# Patient Record
Sex: Female | Born: 1990 | Race: Black or African American | Hispanic: No | Marital: Single | Smoking: Never smoker
Health system: Southern US, Community
[De-identification: ages and names within clinical notes are randomized; demographics above are authoritative.]

## PROBLEM LIST (undated history)

## (undated) ENCOUNTER — Inpatient Hospital Stay: Payer: Self-pay

## (undated) DIAGNOSIS — Z8742 Personal history of other diseases of the female genital tract: Secondary | ICD-10-CM

## (undated) DIAGNOSIS — E782 Mixed hyperlipidemia: Secondary | ICD-10-CM

## (undated) DIAGNOSIS — R7302 Impaired glucose tolerance (oral): Secondary | ICD-10-CM

## (undated) HISTORY — DX: Personal history of other diseases of the female genital tract: Z87.42

## (undated) HISTORY — DX: Impaired glucose tolerance (oral): R73.02

## (undated) HISTORY — DX: Mixed hyperlipidemia: E78.2

---
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
## 2008-11-21 IMAGING — US US BREAST BILAT
1 series · 17 of 17 positions shown · non-contrast
Comparison: none

REASON FOR EXAM: Possible cyst in both breasts
COMMENTS:

[Series 1: us breast bilat · 17 of 17 slices shown]
[im 1/17]
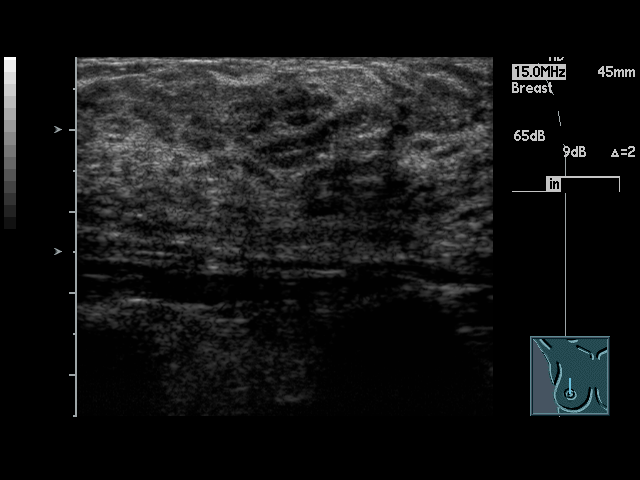
[im 2/17]
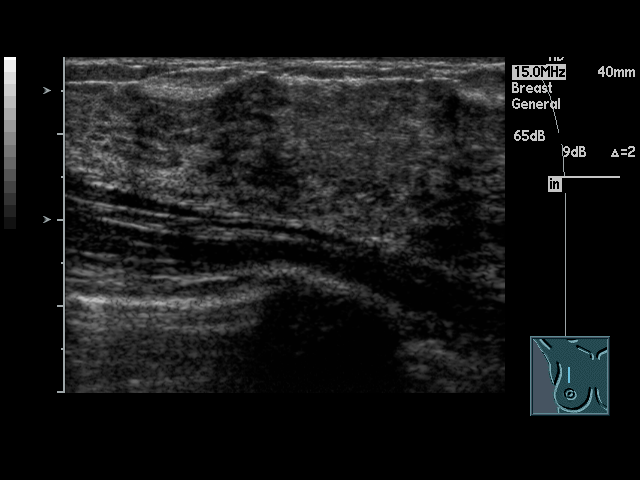
[im 3/17]
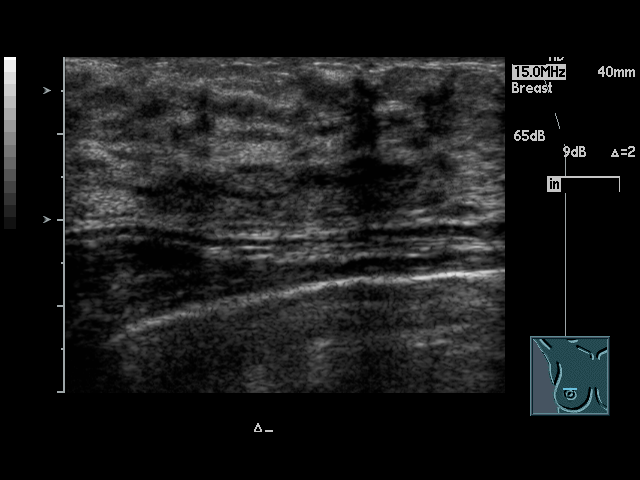
[im 4/17]
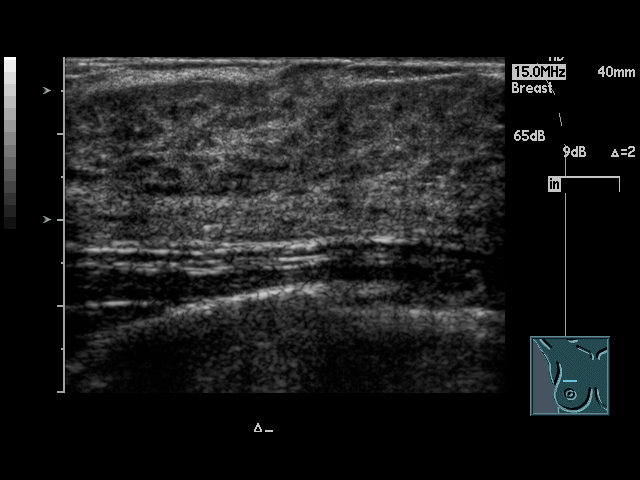
[im 5/17]
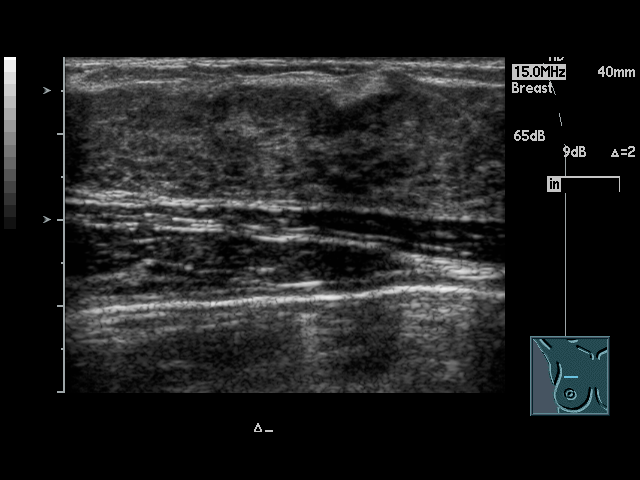
[im 6/17]
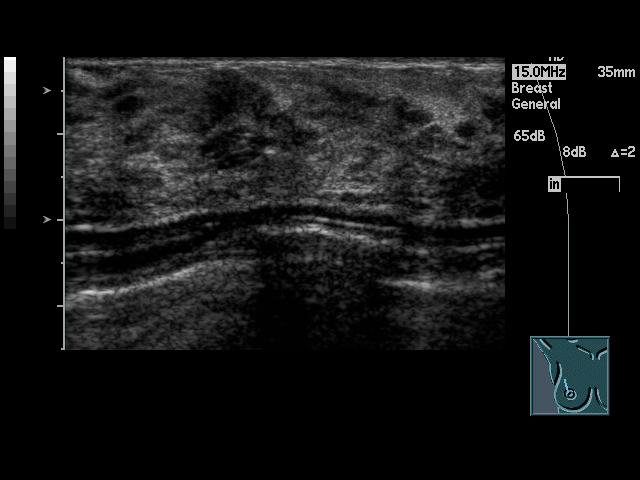
[im 7/17]
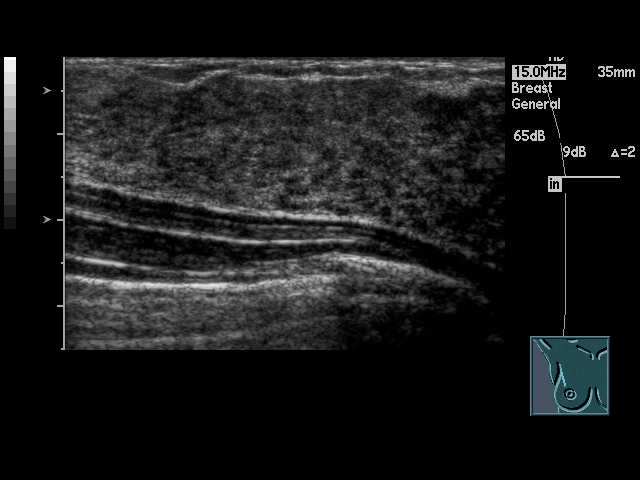
[im 8/17]
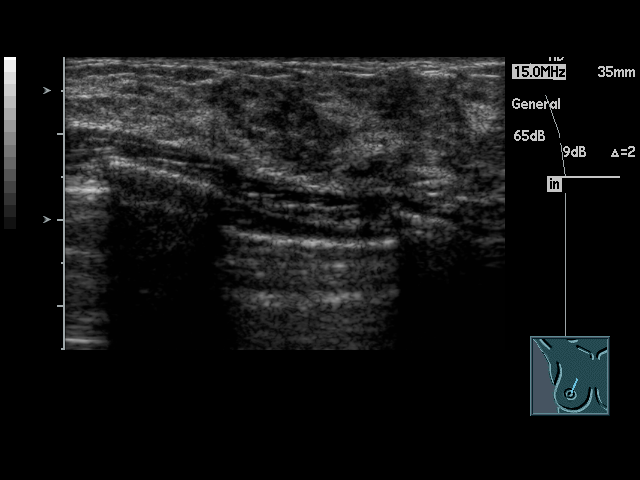
[im 9/17]
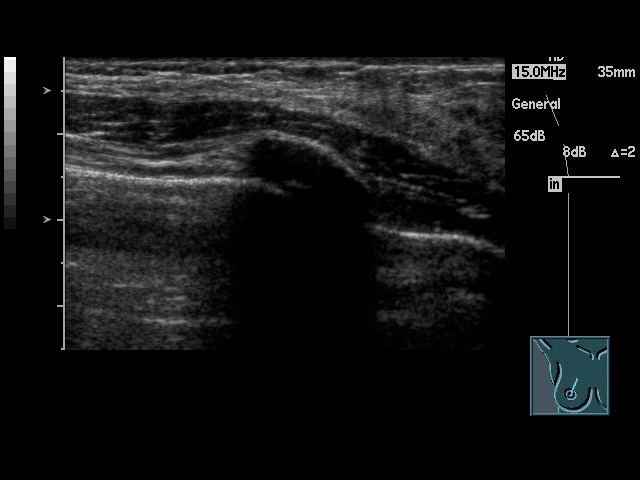
[im 10/17]
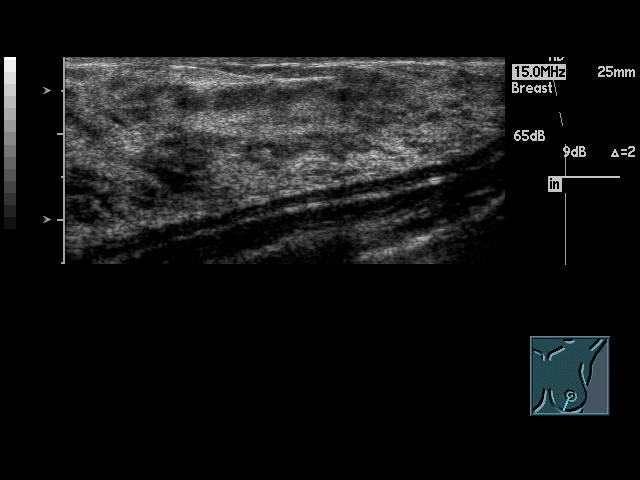
[im 11/17]
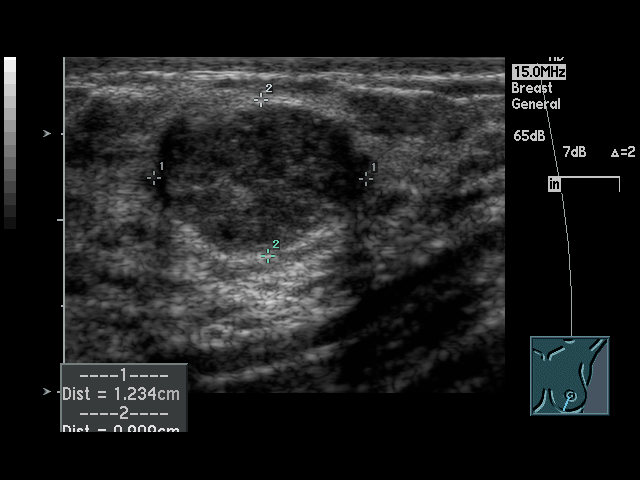
[im 12/17]
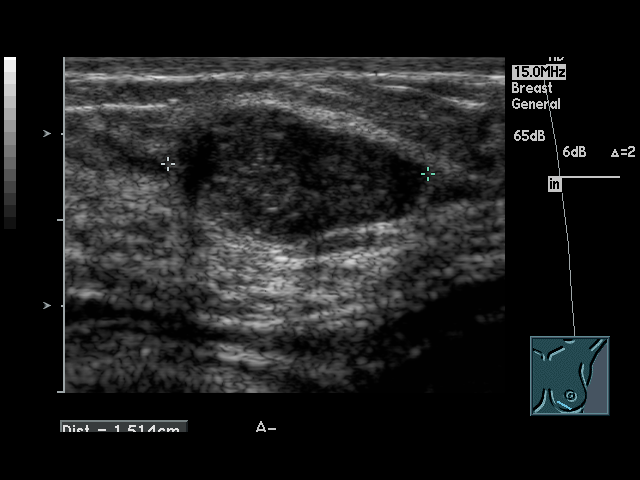
[im 13/17]
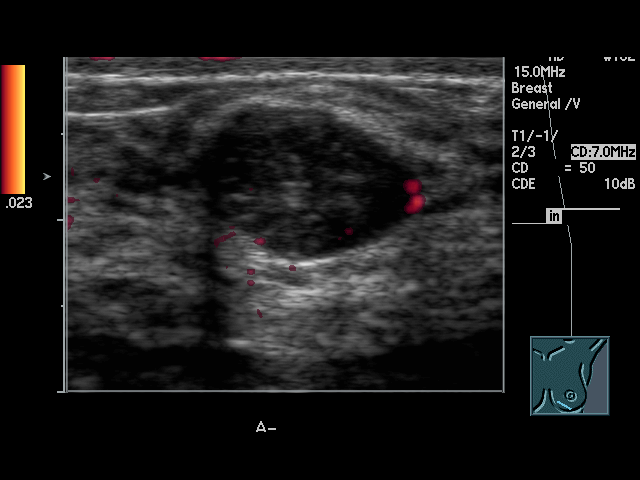
[im 14/17]
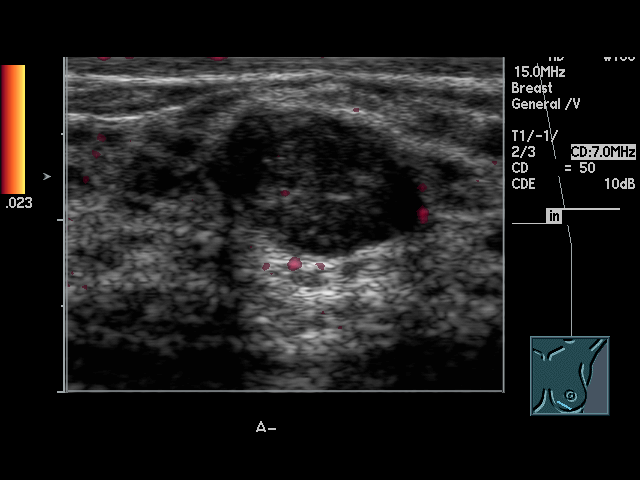
[im 15/17]
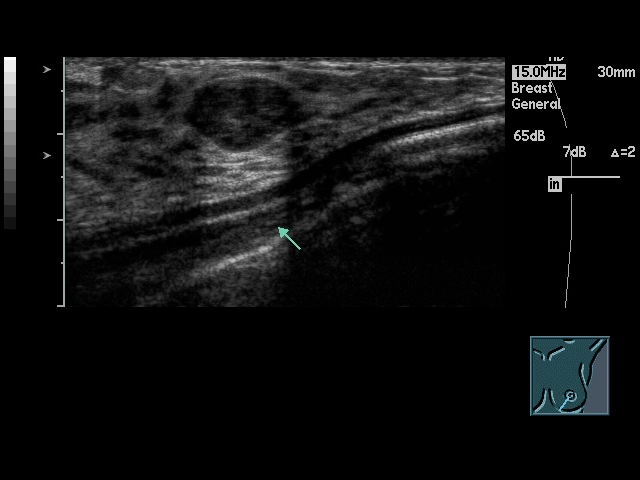
[im 16/17]
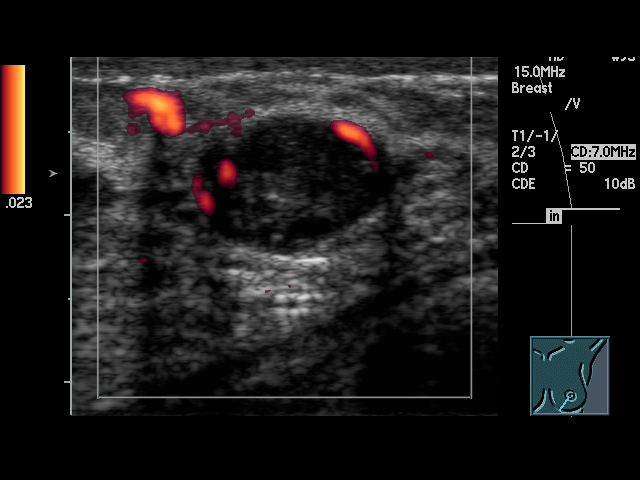
[im 17/17]
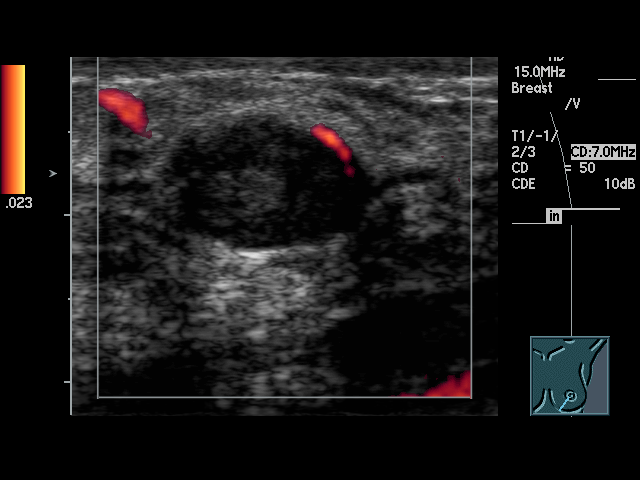

[17 of 17 positions shown; findings below may reference images not displayed]

PROCEDURE:     US  - US BREAST BILATERAL  - February 14, 2007 [DATE]

RESULT:     Bilateral Breast Ultrasound was performed.

The patient has a palpable area of concern in the RIGHT breast. This area
and the adjacent breast parenchyma between 11 o'clock and 1 o'clock in the
RIGHT breast were scanned. No significant sonographic abnormalities are
seen. No solid or cystic mass lesions are seen. There is no skin thickening.
No distortion of the breast architecture is seen.

The patient has a palpable area of concern in the LEFT breast between 6 and
7 o'clock. Sonographic examination, targeted to this region, shows a
hypoechoic, solid appearing mass. The mass measures 1.514 cm at maximum
diameter which is similar to the maximum measurement obtained on the prior
exam of December 27, 2005. In this age group, a fibroadenoma would be the
primary consideration on a statistical basis. Ultrasound findings, however,
are nonspecific.
IMPRESSION: 1.  No significant sonographic abnormalities are noted on the RIGHT.
2.  On the LEFT, there is a hypoechoic, solid, oval-shaped mass that has not
changed appreciably in size since the prior exam. Fibroadenoma would be the
first consideration but biopsy would be needed to definitely establish
histology.

## 2009-10-26 ENCOUNTER — Ambulatory Visit: Payer: Self-pay | Admitting: Internal Medicine

## 2011-08-03 IMAGING — US US BREAST BILAT
1 series · 17 of 25 positions shown · non-contrast
Comparison: none

REASON FOR EXAM: R br mass 9 oclock  L br mass  6-7 oclock
COMMENTS:

[Series 1: us breast bilat · 17 of 25 slices shown]
[im 1/25]
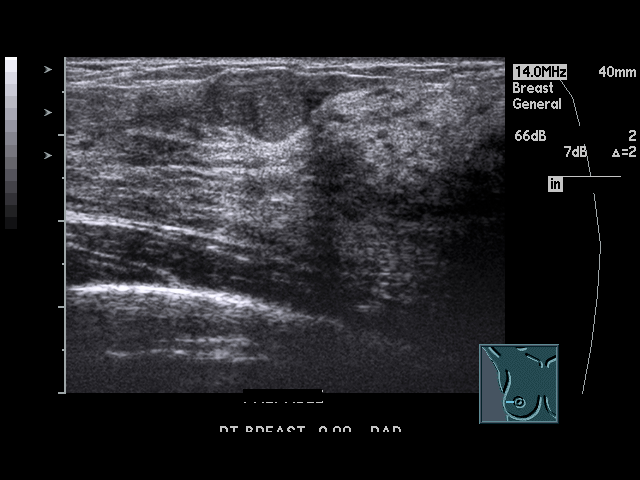
[im 3/25]
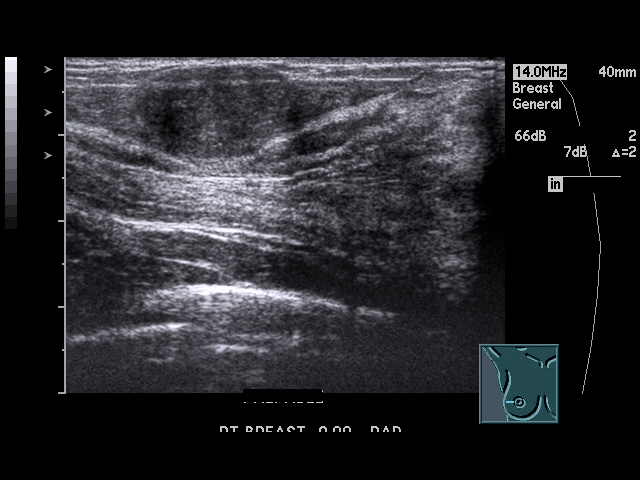
[im 4/25]
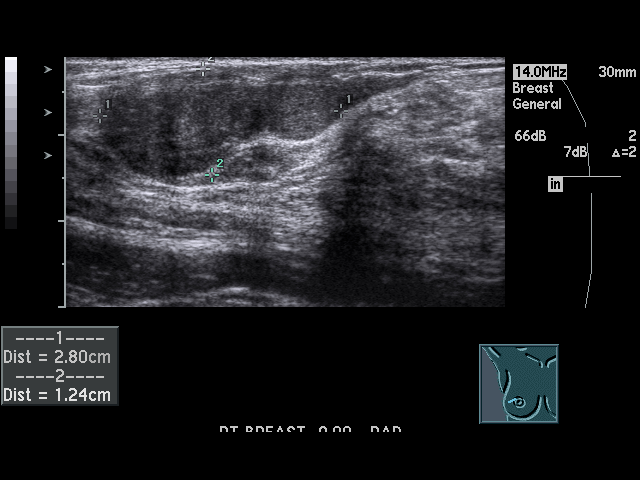
[im 6/25]
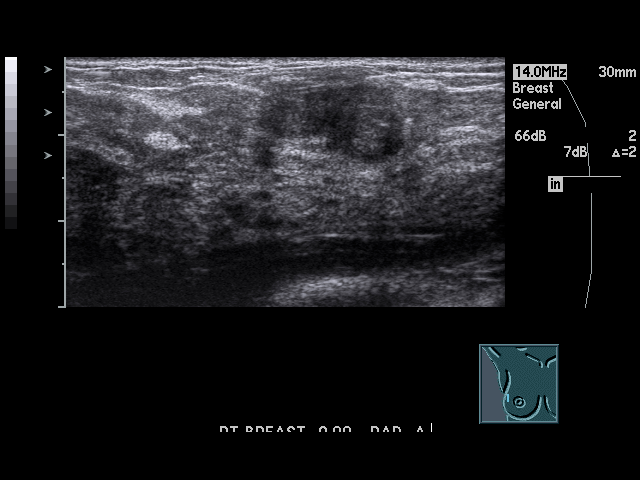
[im 7/25]
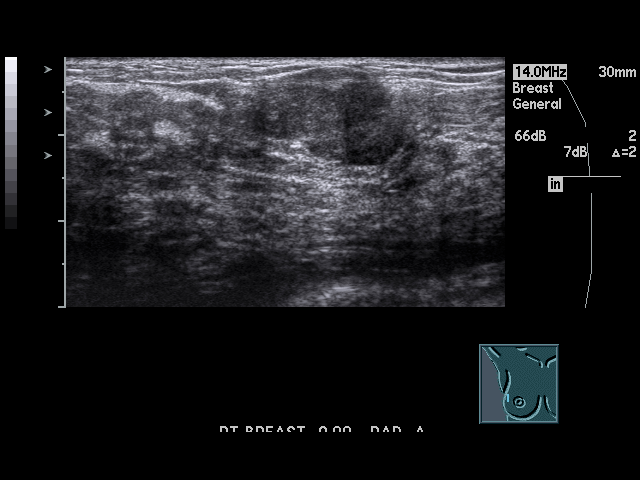
[im 9/25]
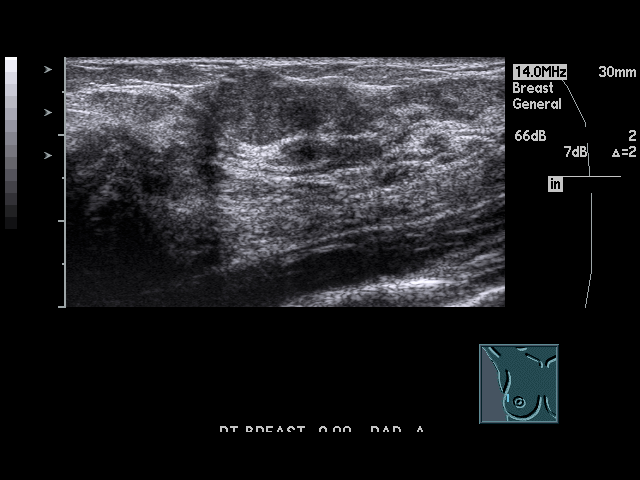
[im 10/25]
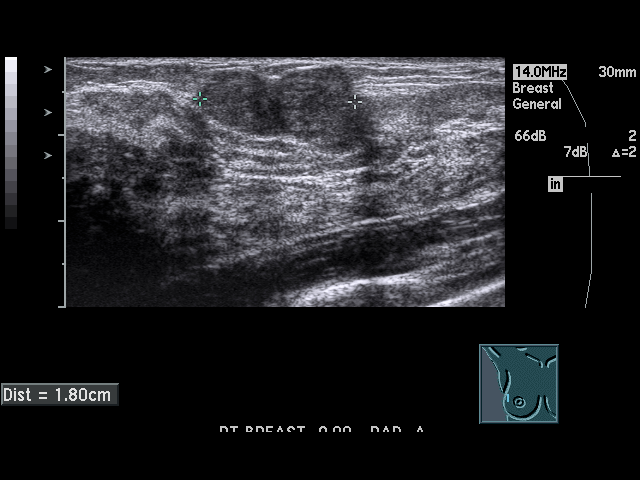
[im 12/25]
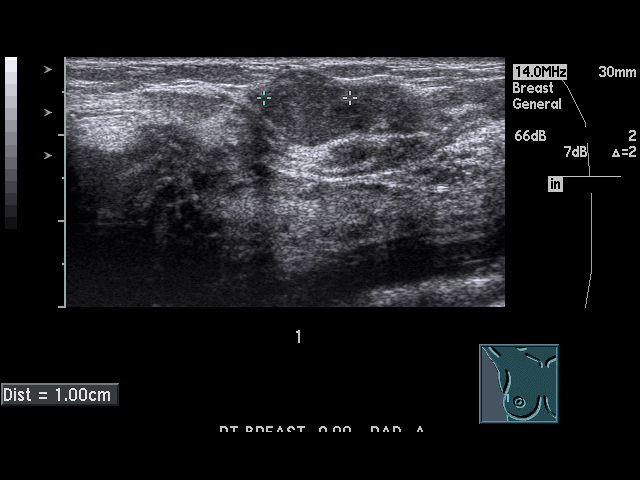
[im 13/25]
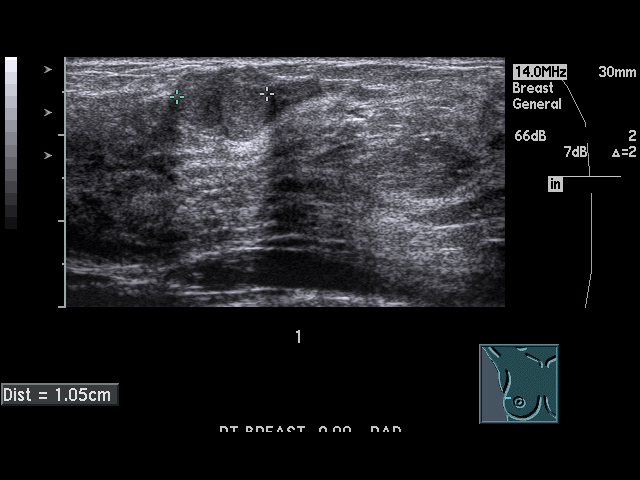
[im 14/25]
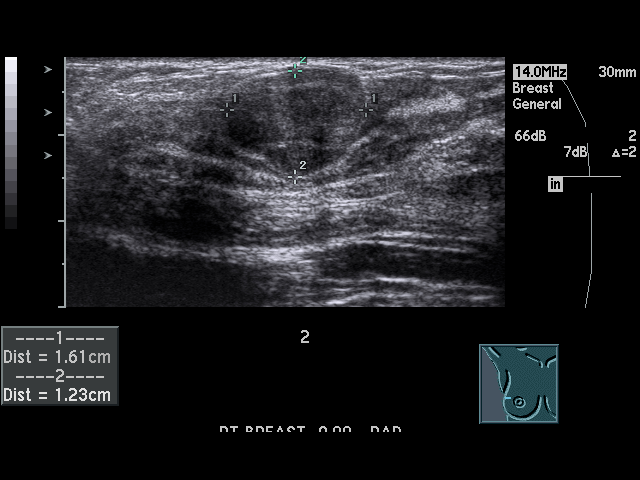
[im 16/25]
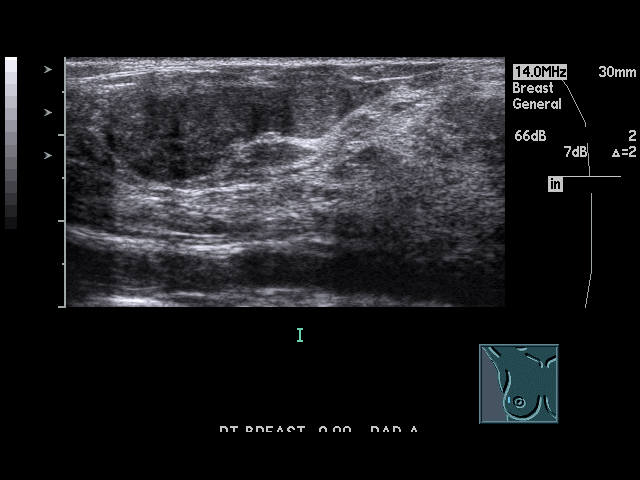
[im 17/25]
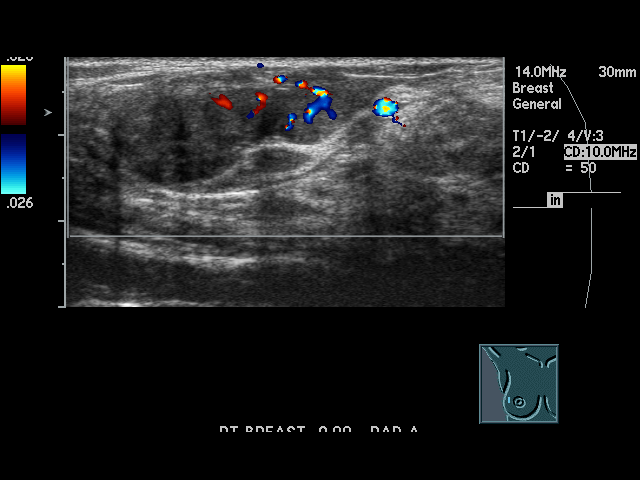
[im 19/25]
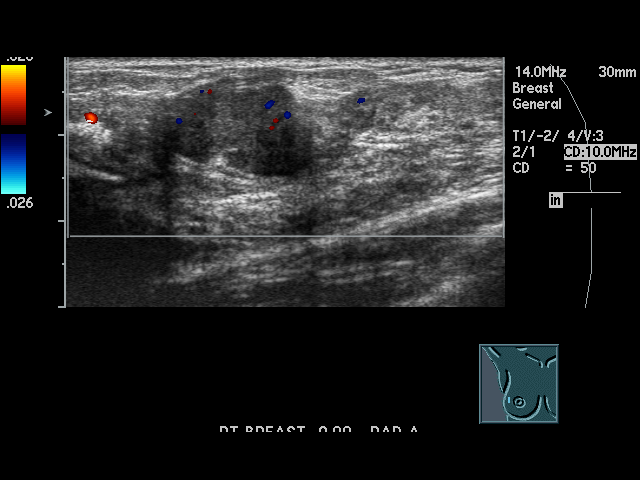
[im 20/25]
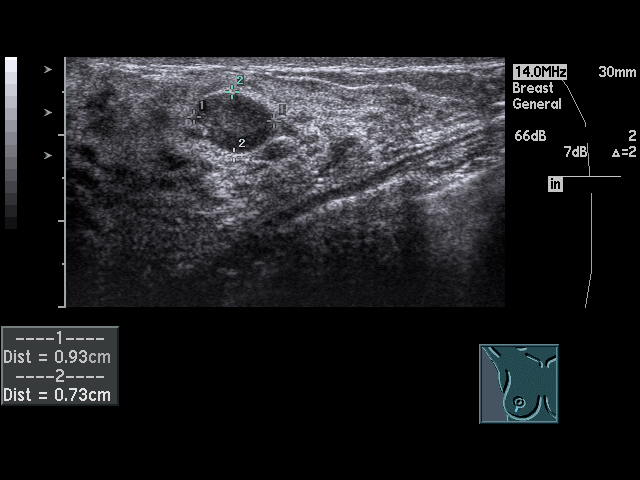
[im 22/25]
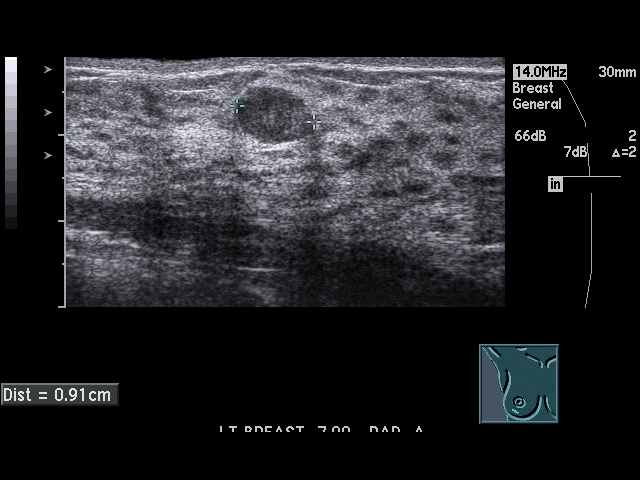
[im 23/25]
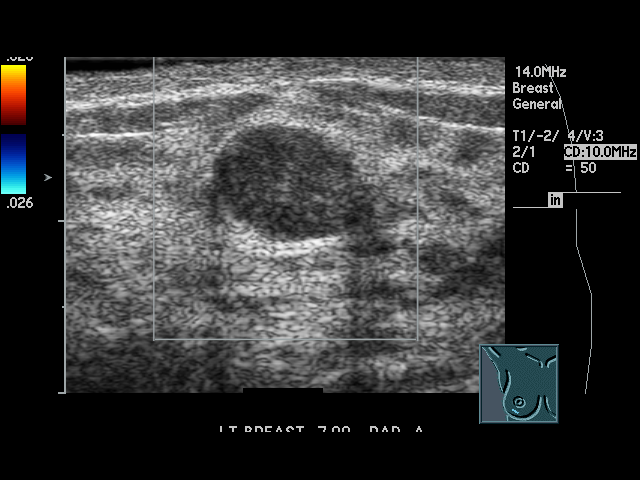
[im 25/25]
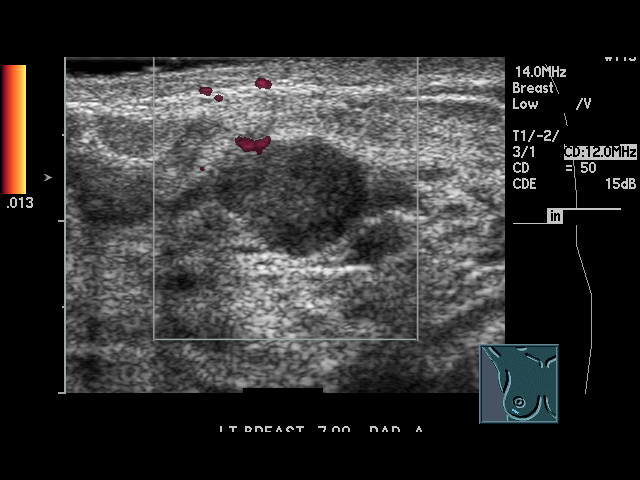

[17 of 25 positions shown; findings below may reference images not displayed]

PROCEDURE:     US  - US BREAST BILATERAL  - October 26, 2009  [DATE]

RESULT:     The patient has a palpable area of concern in the right breast
at approximately 9 o'clock. Ultrasound examination shows a smoothly
marginated, lobulated, subcutaneous solid mass at the palpable area of
concern. The visualized margins are smooth. No hypervascularity is seen. The
histology of the mass cannot be accurately determined by ultrasound but
fibroadenoma would be the first consideration as to etiology.

On the left, there is a 9.3 mm hypoechoic mass at 7 o'clock. The mass on the
left is smaller than that observed on the prior exam of February 14, 2007,
at which time a 1.51 cm hypoechoic mass was observed.
IMPRESSION: 1. The previously noted hypoechoic mass at 7 o'clock is smaller than that
observed on the prior exam of [DATE] [DATE], [DATE].
[DATE]. There is a new hypoechoic mass in the right breast as noted above. A
lobulated fibroadenoma with be the first consideration but the histology
cannot be accurately determined by ultrasound. Biopsy or follow-up is
recommended.

## 2011-11-08 ENCOUNTER — Emergency Department: Payer: Self-pay | Admitting: Emergency Medicine

## 2011-11-08 LAB — URINALYSIS, COMPLETE
Ketone: NEGATIVE
Nitrite: POSITIVE
Ph: 6 (ref 4.5–8.0)
Protein: 100
Specific Gravity: 1.019 (ref 1.003–1.030)

## 2011-11-10 LAB — URINE CULTURE

## 2014-07-14 ENCOUNTER — Ambulatory Visit
Admission: EM | Admit: 2014-07-14 | Discharge: 2014-07-14 | Disposition: A | Discharge status: Home / Self Care | Payer: BLUE CROSS/BLUE SHIELD | Attending: Family Medicine | Admitting: Family Medicine

## 2014-07-14 ENCOUNTER — Encounter: Payer: Self-pay | Admitting: Emergency Medicine

## 2014-07-14 DIAGNOSIS — N390 Urinary tract infection, site not specified: Secondary | ICD-10-CM | POA: Insufficient documentation

## 2014-07-14 DIAGNOSIS — R300 Dysuria: Secondary | ICD-10-CM | POA: Diagnosis present

## 2014-07-14 DIAGNOSIS — M549 Dorsalgia, unspecified: Secondary | ICD-10-CM | POA: Diagnosis present

## 2014-07-14 LAB — URINALYSIS COMPLETE WITH MICROSCOPIC (ARMC ONLY)
Bilirubin Urine: NEGATIVE
GLUCOSE, UA: NEGATIVE mg/dL
Ketones, ur: NEGATIVE mg/dL
NITRITE: NEGATIVE
PROTEIN: NEGATIVE mg/dL
Specific Gravity, Urine: 1.03 (ref 1.005–1.030)
pH: 5.5 (ref 5.0–8.0)

## 2014-07-14 MED ORDER — FLUCONAZOLE 150 MG PO TABS: 150.0000 mg | ORAL_TABLET | Freq: Once | ORAL | Status: DC

## 2014-07-14 MED ORDER — CIPROFLOXACIN HCL 500 MG PO TABS: 500.0000 mg | ORAL_TABLET | Freq: Two times a day (BID) | ORAL | Status: DC

## 2014-07-14 NOTE — ED Provider Notes (Signed)
CSN: [id number]     Arrival date & time 07/14/14  0715 History   First MD Initiated Contact with Patient 07/14/14 0750     Chief Complaint  Patient presents with  . Back Pain  . Dysuria   (Consider location/radiation/quality/duration/timing/severity/associated sxs/prior Treatment) Patient is a 24 y.o. female presenting with back pain and dysuria. The history is provided by the patient. No language interpreter was used.  Back Pain Location:  Generalized Quality:  Burning Radiates to:  Does not radiate Pain severity:  Mild Pain is:  Unable to specify Duration:  2 days Progression:  Unchanged Relieved by:  Nothing Worsened by:  Nothing tried Associated symptoms: dysuria   Risk factors: no hx of cancer, no hx of osteoporosis, no lack of exercise, no menopause, not obese, not pregnant, no recent surgery, no steroid use and no vascular disease   Dysuria  She reports having at least one previous UTI. History reviewed. No pertinent past medical history. Past Surgical History  Procedure Laterality Date  . Foot surgery Left    History reviewed. No pertinent family history. History  Substance Use Topics  . Smoking status: Never Smoker   . Smokeless tobacco: Never Used  . Alcohol Use: Yes   OB History    No data available     Review of Systems  Genitourinary: Positive for dysuria.  Musculoskeletal: Positive for back pain.  All other systems reviewed and are negative.   Allergies  Review of patient's allergies indicates no known allergies.  Home Medications   Prior to Admission medications   Medication Sig Start Date End Date Taking? Authorizing Provider  etonogestrel-ethinyl estradiol (NUVARING) 0.12-0.015 MG/24HR vaginal ring Place 1 each vaginally every 28 (twenty-eight) days. Insert vaginally and leave in place for 3 consecutive weeks, then remove for 1 week.   Yes Historical Provider, MD  ciprofloxacin (CIPRO) 500 MG tablet Take 1 tablet (500 mg total) by mouth 2 (two)  times daily. 07/14/14   Hassan RowanEugene Dantre [place], MD  fluconazole (DIFLUCAN) 150 MG tablet Take 1 tablet (150 mg total) by mouth once. If needed and repeat in a week if needed 07/14/14   Hassan RowanEugene Rickia [place], MD   BP 122/63 mmHg  Pulse 63  Temp(Src) 98.4 F (36.9 C) (Tympanic)  Resp 16  Ht 5\' 3"  (1.6 m)  Wt 143 lb (64.864 kg)  BMI 25.34 kg/m2  SpO2 98%  LMP 06/24/2014 (Exact Date) Physical Exam  Constitutional: She is oriented to person, place, and time. She appears well-developed and well-nourished.  HENT:  Head: Normocephalic and atraumatic.  Abdominal: Soft. Bowel sounds are normal. She exhibits no distension. There is no tenderness.  Genitourinary:  no tenderness over back  Musculoskeletal: Normal range of motion. She exhibits no edema or tenderness.  Neurological: [name], place, and time.  Skin: Skin is warm and dry. No erythema.    ED Course  Procedures (including critical care time) Labs Review Labs Reviewed  URINALYSIS COMPLETEWITH MICROSCOPIC [facility])  - Abnormal; Notable for the following:    APPearance HAZY (*)    Hgb urine dipstick 1+ (*)    Leukocytes, UA 2+ (*)    Bacteria, UA FEW (*)    Squamous Epithelial / LPF 0-5 (*)    All other components within normal limits  URINE CULTURE  Will place on antibiotic. Return if needed.   Imaging Review No results found.   MDM   1. UTI (lower urinary tract infection)  Hassan RowanEugene Fed Ceci, MD 07/14/14 2101

## 2014-07-14 NOTE — ED Notes (Signed)
Patient c/o lower back pain and burning when urinating since yesterday.  Patient denies fevers.

## 2014-07-14 NOTE — Discharge Instructions (Signed)

## 2014-08-14 LAB — URINE CULTURE: Special Requests: NORMAL

## 2016-05-20 ENCOUNTER — Ambulatory Visit
Admission: EM | Admit: 2016-05-20 | Discharge: 2016-05-20 | Disposition: A | Discharge status: Home / Self Care | Payer: BLUE CROSS/BLUE SHIELD | Attending: Family Medicine | Admitting: Family Medicine

## 2016-05-20 DIAGNOSIS — J069 Acute upper respiratory infection, unspecified: Secondary | ICD-10-CM

## 2016-05-20 DIAGNOSIS — H60391 Other infective otitis externa, right ear: Secondary | ICD-10-CM | POA: Diagnosis not present

## 2016-05-20 LAB — RAPID STREP SCREEN (MED CTR MEBANE ONLY): Streptococcus, Group A Screen (Direct): NEGATIVE

## 2016-05-20 LAB — MONONUCLEOSIS SCREEN: Mono Screen: NEGATIVE

## 2016-05-20 MED ORDER — NEOMYCIN-POLYMYXIN-HC 3.5-10000-1 OT SUSP: 4.0000 [drp] | Freq: Three times a day (TID) | OTIC | 0 refills | Status: DC

## 2016-05-20 MED ORDER — FLUTICASONE PROPIONATE 50 MCG/ACT NA SUSP: 2.0000 | Freq: Every day | NASAL | 0 refills | Status: DC

## 2016-05-20 MED ORDER — BENZONATATE 200 MG PO CAPS: 200.0000 mg | ORAL_CAPSULE | Freq: Three times a day (TID) | ORAL | 0 refills | Status: DC

## 2016-05-20 NOTE — ED Triage Notes (Signed)
One week of mild cough and sore throat pain. Throat mildly reddened, no white spots. Right ear "draining" and tender. No fever. No other sx.

## 2016-05-20 NOTE — ED Provider Notes (Signed)
CSN: [id number]     Arrival date & time 05/20/16  [id number] History   First MD Initiated Contact with Patient 05/20/16 1001     Chief Complaint  Patient presents with  . Sore Throat  . Otalgia   (Consider location/radiation/quality/duration/timing/severity/associated sxs/prior Treatment) HPI  A 26 year old female who presents with one-week history of a mild cough and sore throat pain. She's also had some right ear draining and tenderness. She's had no fever or chills. He works as a Psychologist, occupationalbanker. He has had more fatigued than usual.      History reviewed. No pertinent past medical history. Past Surgical History:  Procedure Laterality Date  . FOOT SURGERY Left    History reviewed. No pertinent family history. Social History  Substance Use Topics  . Smoking status: Never Smoker  . Smokeless tobacco: Never Used  . Alcohol use Yes     Comment: social   OB History    No data available     Review of Systems  Constitutional: Positive for fatigue. Negative for activity change, chills and fever.  HENT: Positive for congestion, ear discharge, ear pain, postnasal drip, sore throat and trouble swallowing.   Respiratory: Positive for cough. Negative for shortness of breath, wheezing and stridor.   All other systems reviewed and are negative.   Allergies  Patient has no known allergies.  Home Medications   Prior to Admission medications   Medication Sig Start Date End Date Taking? Authorizing Provider  desogestrel-ethinyl estradiol (KARIVA,AZURETTE,MIRCETTE) 0.15-0.02/0.01 MG (21/5) tablet Take 1 tablet by mouth daily.   Yes Historical Provider, MD  benzonatate (TESSALON) 200 MG capsule Take 1 capsule (200 mg total) by mouth every 8 (eight) hours. As necessary for cough 05/20/16   Lutricia FeilWilliam P Roemer, PA-C  fluconazole (DIFLUCAN) 150 MG tablet Take 1 tablet (150 mg total) by mouth once. If needed and repeat in a week if needed 07/14/14   Hassan RowanEugene Wade, MD  fluticasone [facility](FLONASE) 50 MCG/ACT nasal  spray Place 2 sprays into both nostrils daily. 05/20/16   Lutricia FeilWilliam P Roemer, PA-C  neomycin-polymyxin-hydrocortisone (CORTISPORIN) 3.5-10000-1 otic suspension Place 4 drops into the right ear 3 (three) times daily. 05/20/16   Lutricia FeilWilliam P Roemer, PA-C   Meds Ordered and Administered this Visit  Medications - No data to display  BP 115/61 (BP Location: Left Arm)   Pulse (!) 59   Temp 98.4 F (36.9 C) (Oral)   Resp 16   Ht 5\' 4"  (1.626 m)   Wt 140 lb (63.5 kg)   LMP 04/26/2016   SpO2 100%   BMI 24.03 kg/m  No data found.   Physical Exam  Constitutional: She is oriented to person, place, and time. She appears well-developed and well-nourished. No distress.  HENT:  Head: Normocephalic and atraumatic.  Mouth/Throat: Oropharynx is clear and moist. No oropharyngeal exudate.  Right ear canal is very reddened and tender  Eyes: Pupils are equal, round, and reactive to light. Right eye exhibits no discharge. Left eye exhibits no discharge.  Neck: Normal range of motion. Neck supple.  Pulmonary/Chest: Effort normal and breath sounds normal. No respiratory distress. She has no wheezes. She has no rales.  Musculoskeletal: Normal range of motion.  Lymphadenopathy:    She has no cervical adenopathy.  Neurological: [name], place, and time.  Skin: Skin is warm and dry. She is not diaphoretic.  Psychiatric: She has a normal mood and affect. Her behavior is normal. Judgment and thought content normal.  Nursing note  and vitals reviewed.   Urgent Care Course     Procedures (including critical care time)  Labs Review Labs Reviewed  RAPID STREP SCREEN (NOT AT [facility])  CULTURE, GROUP A STREP [facility])  MONONUCLEOSIS SCREEN    Imaging Review No results found.   Visual Acuity Review  Right Eye Distance:   Left Eye Distance:   Bilateral Distance:    Right Eye Near:   Left Eye Near:    Bilateral Near:         MDM   1. Other infective acute otitis externa of  right ear   2. Upper respiratory tract infection, unspecified type    Plan: 1. Test/x-ray results and diagnosis reviewed with patient 2. rx as per orders; risks, benefits, potential side effects reviewed with patient 3. Recommend supportive treatment with  [place] gargles as necessary for comfort use lozenges throughout the day. Do not put objects in your ear to scratch. This is more likely a viral illness and does not require antibiotics. Your cultures and sensitivities of the throat swab will be available in 48 hours. 4. F/u prn if symptoms worsen or don't improve     Lutricia Feil, PA-C 05/20/16 1119

## 2016-05-22 LAB — CULTURE, GROUP A STREP (THRC)

## 2017-03-06 ENCOUNTER — Encounter: Payer: Self-pay | Admitting: Maternal Newborn

## 2017-03-06 ENCOUNTER — Ambulatory Visit (INDEPENDENT_AMBULATORY_CARE_PROVIDER_SITE_OTHER): Payer: BLUE CROSS/BLUE SHIELD | Admitting: Maternal Newborn

## 2017-03-06 VITALS — BP 120/70 | Wt 162.0 lb

## 2017-03-06 DIAGNOSIS — Z3400 Encounter for supervision of normal first pregnancy, unspecified trimester: Secondary | ICD-10-CM

## 2017-03-06 DIAGNOSIS — Z1379 Encounter for other screening for genetic and chromosomal anomalies: Secondary | ICD-10-CM

## 2017-03-06 NOTE — Patient Instructions (Signed)
First Trimester of Pregnancy The first trimester of pregnancy is from week 1 until the end of week 13 (months 1 through 3). A week after a sperm fertilizes an egg, the egg will implant on the wall of the uterus. This embryo will begin to develop into a baby. Genes from you and your partner will form the baby. The female genes will determine whether the baby will be a boy or a girl. At 6-8 weeks, the eyes and face will be formed, and the heartbeat can be seen on ultrasound. At the end of 12 weeks, all the baby's organs will be formed. Now that you are pregnant, you will want to do everything you can to have a healthy baby. Two of the most important things are to get good prenatal care and to follow your health care provider's instructions. Prenatal care is all the medical care you receive before the baby's birth. This care will help prevent, find, and treat any problems during the pregnancy and childbirth. Body changes during your first trimester Your body goes through many changes during pregnancy. The changes vary from woman to woman.  You may gain or lose a couple of pounds at first.  You may feel sick to your stomach (nauseous) and you may throw up (vomit). If the vomiting is uncontrollable, call your health care provider.  You may tire easily.  You may develop headaches that can be relieved by medicines. All medicines should be approved by your health care provider.  You may urinate more often. Painful urination may mean you have a bladder infection.  You may develop heartburn as a result of your pregnancy.  You may develop constipation because certain hormones are causing the muscles that push stool through your intestines to slow down.  You may develop hemorrhoids or swollen veins (varicose veins).  Your breasts may begin to grow larger and become tender. Your nipples may stick out more, and the tissue that surrounds them (areola) may become darker.  Your gums may bleed and may be  sensitive to brushing and flossing.  Dark spots or blotches (chloasma, mask of pregnancy) may develop on your face. This will likely fade after the baby is born.  Your menstrual periods will stop.  You may have a loss of appetite.  You may develop cravings for certain kinds of food.  You may have changes in your emotions from day to day, such as being excited to be pregnant or being concerned that something may go wrong with the pregnancy and baby.  You may have more vivid and strange dreams.  You may have changes in your hair. These can include thickening of your hair, rapid growth, and changes in texture. Some women also have hair loss during or after pregnancy, or hair that feels dry or thin. Your hair will most likely return to normal after your baby is born.  What to expect at prenatal visits During a routine prenatal visit:  You will be weighed to make sure you and the baby are growing normally.  Your blood pressure will be taken.  Your abdomen will be measured to track your baby's growth.  The fetal heartbeat will be listened to between weeks 10 and 14 of your pregnancy.  Test results from any previous visits will be discussed.  Your health care provider may ask you:  How you are feeling.  If you are feeling the baby move.  If you have had any abnormal symptoms, such as leaking fluid, bleeding, severe headaches,   or abdominal cramping.  If you are using any tobacco products, including cigarettes, chewing tobacco, and electronic cigarettes.  If you have any questions.  Other tests that may be performed during your first trimester include:  Blood tests to find your blood type and to check for the presence of any previous infections. The tests will also be used to check for low iron levels (anemia) and protein on red blood cells (Rh antibodies). Depending on your risk factors, or if you previously had diabetes during pregnancy, you may have tests to check for high blood  sugar that affects pregnant women (gestational diabetes).  Urine tests to check for infections, diabetes, or protein in the urine.  An ultrasound to confirm the proper growth and development of the baby.  Fetal screens for spinal cord problems (spina bifida) and Down syndrome.  HIV (human immunodeficiency virus) testing. Routine prenatal testing includes screening for HIV, unless you choose not to have this test.  You may need other tests to make sure you and the baby are doing well.  Follow these instructions at home: Medicines  Follow your health care provider's instructions regarding medicine use. Specific medicines may be either safe or unsafe to take during pregnancy.  Take a prenatal vitamin that contains at least 600 micrograms (mcg) of folic acid.  If you develop constipation, try taking a stool softener if your health care provider approves. Eating and drinking  Eat a balanced diet that includes fresh fruits and vegetables, whole grains, good sources of protein such as meat, eggs, or tofu, and low-fat dairy. Your health care provider will help you determine the amount of weight gain that is right for you.  Avoid raw meat and uncooked cheese. These carry germs that can cause birth defects in the baby.  Eating four or five small meals rather than three large meals a day may help relieve nausea and vomiting. If you start to feel nauseous, eating a few soda crackers can be helpful. Drinking liquids between meals, instead of during meals, also seems to help ease nausea and vomiting.  Limit foods that are high in fat and processed sugars, such as fried and sweet foods.  To prevent constipation: ? Eat foods that are high in fiber, such as fresh fruits and vegetables, whole grains, and beans. ? Drink enough fluid to keep your urine clear or pale yellow. Activity  Exercise only as directed by your health care provider. Most women can continue their usual exercise routine during  pregnancy. Try to exercise for 30 minutes at least 5 days a week. Exercising will help you: ? Control your weight. ? Stay in shape. ? Be prepared for labor and delivery.  Experiencing pain or cramping in the lower abdomen or lower back is a good sign that you should stop exercising. Check with your health care provider before continuing with normal exercises.  Try to avoid standing for long periods of time. Move your legs often if you must stand in one place for a long time.  Avoid heavy lifting.  Wear low-heeled shoes and practice good posture.  You may continue to have sex unless your health care provider tells you not to. Relieving pain and discomfort  Wear a good support bra to relieve breast tenderness.  Take warm sitz baths to soothe any pain or discomfort caused by hemorrhoids. Use hemorrhoid cream if your health care provider approves.  Rest with your legs elevated if you have leg cramps or low back pain.  If you develop   varicose veins in your legs, wear support hose. Elevate your feet for 15 minutes, 3-4 times a day. Limit salt in your diet. Prenatal care  Schedule your prenatal visits by the twelfth week of pregnancy. They are usually scheduled monthly at first, then more often in the last 2 months before delivery.  Write down your questions. Take them to your prenatal visits.  Keep all your prenatal visits as told by your health care provider. This is important. Safety  Wear your seat belt at all times when driving.  Make a list of emergency phone numbers, including numbers for family, friends, the hospital, and police and fire departments. General instructions  Ask your health care provider for a referral to a local prenatal education class. Begin classes no later than the beginning of month 6 of your pregnancy.  Ask for help if you have counseling or nutritional needs during pregnancy. Your health care provider can offer advice or refer you to specialists for help  with various needs.  Do not use hot tubs, steam rooms, or saunas.  Do not douche or use tampons or scented sanitary pads.  Do not cross your legs for long periods of time.  Avoid cat litter boxes and soil used by cats. These carry germs that can cause birth defects in the baby and possibly loss of the fetus by miscarriage or stillbirth.  Avoid all smoking, herbs, alcohol, and medicines not prescribed by your health care provider. Chemicals in these products affect the formation and growth of the baby.  Do not use any products that contain nicotine or tobacco, such as cigarettes and e-cigarettes. If you need help quitting, ask your health care provider. You may receive counseling support and other resources to help you quit.  Schedule a dentist appointment. At home, brush your teeth with a soft toothbrush and be gentle when you floss. Contact a health care provider if:  You have dizziness.  You have mild pelvic cramps, pelvic pressure, or nagging pain in the abdominal area.  You have persistent nausea, vomiting, or diarrhea.  You have a bad smelling vaginal discharge.  You have pain when you urinate.  You notice increased swelling in your face, hands, legs, or ankles.  You are exposed to fifth disease or chickenpox.  You are exposed to German measles (rubella) and have never had it. Get help right away if:  You have a fever.  You are leaking fluid from your vagina.  You have spotting or bleeding from your vagina.  You have severe abdominal cramping or pain.  You have rapid weight gain or loss.  You vomit blood or material that looks like coffee grounds.  You develop a severe headache.  You have shortness of breath.  You have any kind of trauma, such as from a fall or a car accident. Summary  The first trimester of pregnancy is from week 1 until the end of week 13 (months 1 through 3).  Your body goes through many changes during pregnancy. The changes vary from  woman to woman.  You will have routine prenatal visits. During those visits, your health care provider will examine you, discuss any test results you may have, and talk with you about how you are feeling. This information is not intended to replace advice given to you by your health care provider. Make sure you discuss any questions you have with your health care provider. Document Released: 02/08/2001 Document Revised: 01/27/2016 Document Reviewed: 01/27/2016 [employer] Interactive Patient Education  2018 [employer]  

## 2017-03-06 NOTE — Progress Notes (Signed)
No concerns.rj 

## 2017-03-06 NOTE — Progress Notes (Signed)
03/06/2017   Chief Complaint: Amenorrhea, positive home pregnancy test, desires prenatal care.  Transfer of Care Patient: no  History of Present Illness: Sheila Fox is a 27 y.o. G1P0 at 11w 0d based on Patient's last menstrual period on 12/19/2016. with an Estimated Date of Delivery: 09/25/2017, with the above CC.   Her periods were: regular periods every 28 days She was using oral contraceptives (estrogen/progesterone), had just stopped taking pills when she conceived.  She has Negative signs or symptoms of nausea/vomiting of pregnancy. She has Negative signs or symptoms of miscarriage or preterm labor. She identifies Negative Zika risk factors for her and her partner; took cruise that stopped in [place] but says she had no risk of exposure/insect bites. On any different medications around the time she conceived/early pregnancy: No  History of varicella: No   ROS: A 12-point review of systems was performed and negative, except as stated in the above HPI.  OBGYN History: As per HPI. OB History  Gravida Para Term Preterm AB Living  1            SAB TAB Ectopic Multiple Live Births               # Outcome Date GA Lbr Len/2nd Weight Sex Delivery Anes PTL Lv  1 Current               Any issues with any prior pregnancies: not applicable Any prior children are healthy, doing well, without any problems or issues: not applicable History of pap smears: Yes. Last pap smear August 2018, normal per patient.  History of STIs: Yes, chlamydia.   Past Medical History: Past Medical History:  Diagnosis Date  . Hx of abnormal cervical Pap smear     Past Surgical History: Past Surgical History:  Procedure Laterality Date  . FOOT SURGERY Left 2007  . WISDOM TOOTH EXTRACTION  2009   all four    Family History:  Family History  Problem Relation Age of Onset  . Hyperlipidemia Sister    She denies any female cancers, bleeding or blood clotting disorders.  She denies any history of  intellectual disability, birth defects or genetic disorders in her or the FOB's history  Social History:  Social History   Socioeconomic History  . Marital status: Single    Spouse name: Not on file  . Number of children: Not on file  . Years of education: Not on file  . Highest education level: Not on file  Social Needs  . Financial resource strain: Not on file  . Food insecurity - worry: Not on file  . Food insecurity - inability: Not on file  . Transportation needs - medical: Not on file  . Transportation needs - non-medical: Not on file  Occupational History  . Occupation: Psychologist, occupational    Comment: [employer]  Tobacco Use  . Smoking status: Never Smoker  . Smokeless tobacco: Never Used  Substance and Sexual Activity  . Alcohol use: Yes    Comment: social  . Drug use: No  . Sexual activity: Yes    Birth control/protection: Pill, None  Other Topics Concern  . Not on file  Social History Narrative  . Not on file   Any cats in the household: no  Allergy: No Known Allergies  Current Outpatient Medications:  Current Outpatient Medications:  .  Prenatal Vit-Fe Fumarate-FA (MULTIVITAMIN-PRENATAL) 27-0.8 MG TABS tablet, Take 1 tablet by mouth daily at 12 noon., Disp: , Rfl:  Physical Exam:   BP 120/70   Wt 162 lb (73.5 kg)   LMP 12/19/2016   BMI 27.81 kg/m  Body mass index is 27.81 kg/m. Constitutional: Well nourished, well developed female in no acute distress.  Neck:  Supple, normal appearance, and no thyromegaly  Cardiovascular: S1, S2 normal, no murmur, rub or gallop, regular rate and rhythm Respiratory:  Clear to auscultation bilaterally. Normal respiratory effort Abdomen: positive bowel sounds and no masses, hernias; diffusely non tender to palpation, non distended Breasts: breasts appear normal, no suspicious masses, no skin or nipple changes or axillary nodes. Neuro/Psych:  Normal mood and affect.  Skin:  Warm and dry.  Lymphatic:  No inguinal  lymphadenopathy.   Pelvic exam: is not limited by body habitus External genitalia, Bartholin's glands, Urethra, Skene's glands: within normal limits Vagina: within normal limits and with no blood in the vault  Cervix: normal appearing cervix without discharge or lesions, closed/long/high Uterus:  enlarged, c/w 11 week size Adnexa:  no mass, fullness, tenderness  Assessment: Sheila Fox is a 27 y.o. G1P0 at 7011w 0d based on Patient's last menstrual period on 12/19/2016, with an Estimated Date of Delivery: 09/25/2017, presenting for prenatal care.  Plan:  1) Avoid alcoholic beverages. 2) Patient encouraged not to smoke.  3) Discontinue the use of all non-medicinal drugs and chemicals.  4) Take prenatal vitamins daily.  5) Seatbelt use advised. 6) Nutrition, food safety (fish, cheese advisories, and high nitrite foods) and exercise discussed. 7) Hospital and practice style delivering at [facility] discussed. 8) Patient is asked about travel to areas at risk for the Zika virus, and counseled to avoid travel and exposure to mosquitoes or sexual partners who may have themselves been exposed to the virus. Testing is discussed, and will be ordered as appropriate.  9) Childbirth classes at [facility] advised. 10) Genetic Screening, such as with 1st Trimester Screening, cell free fetal DNA, AFP testing, and Ultrasound, as well as with amniocentesis and CVS as appropriate, is discussed with patient. She plans to have genetic testing this pregnancy. 11) [facility], return next week for NT/first trimester screen.  Problem list reviewed and updated.  Return in about 1 week (around 03/13/2017) for ROB following ultrasound.  Marcelyn BruinsJacelyn Antavious Spanos, CNM [facility], [facility] 03/06/2017  1:43 PM

## 2017-03-08 LAB — URINE DRUG PANEL 7
AMPHETAMINES, URINE: NEGATIVE ng/mL
BARBITURATE QUANT UR: NEGATIVE ng/mL
Benzodiazepine Quant, Ur: NEGATIVE ng/mL
COCAINE (METAB.): NEGATIVE ng/mL
Cannabinoid Quant, Ur: NEGATIVE ng/mL
OPIATE QUANT UR: NEGATIVE ng/mL
PCP Quant, Ur: NEGATIVE ng/mL

## 2017-03-08 LAB — URINE CULTURE

## 2017-03-14 ENCOUNTER — Ambulatory Visit (INDEPENDENT_AMBULATORY_CARE_PROVIDER_SITE_OTHER): Payer: BLUE CROSS/BLUE SHIELD | Admitting: Obstetrics and Gynecology

## 2017-03-14 ENCOUNTER — Ambulatory Visit (INDEPENDENT_AMBULATORY_CARE_PROVIDER_SITE_OTHER): Payer: BLUE CROSS/BLUE SHIELD

## 2017-03-14 ENCOUNTER — Encounter: Payer: Self-pay | Admitting: Obstetrics and Gynecology

## 2017-03-14 VITALS — BP 122/70 | Wt 164.0 lb

## 2017-03-14 DIAGNOSIS — Z6791 Unspecified blood type, Rh negative: Secondary | ICD-10-CM

## 2017-03-14 DIAGNOSIS — Z1379 Encounter for other screening for genetic and chromosomal anomalies: Secondary | ICD-10-CM | POA: Diagnosis not present

## 2017-03-14 DIAGNOSIS — O26899 Other specified pregnancy related conditions, unspecified trimester: Secondary | ICD-10-CM

## 2017-03-14 DIAGNOSIS — Z3A12 12 weeks gestation of pregnancy: Secondary | ICD-10-CM

## 2017-03-14 DIAGNOSIS — Z3400 Encounter for supervision of normal first pregnancy, unspecified trimester: Secondary | ICD-10-CM

## 2017-03-14 DIAGNOSIS — O09899 Supervision of other high risk pregnancies, unspecified trimester: Secondary | ICD-10-CM

## 2017-03-14 NOTE — Progress Notes (Signed)
  Routine Prenatal Care Visit  Subjective  Sheila Fox is a 27 y.o. G1P0 at 8273w1d being seen today for ongoing prenatal care.  She is currently monitored for the following issues for this high-risk pregnancy and has Supervision of normal first pregnancy, antepartum and Rh negative state in antepartum period on their problem list.  ----------------------------------------------------------------------------------- Patient reports no complaints.    . Vag. Bleeding: None.  Movement: Absent. Denies leaking of fluid.  NT u/s today. EDD confirmed.  ----------------------------------------------------------------------------------- The following portions of the patient's history were reviewed and updated as appropriate: allergies, current medications, past family history, past medical history, past social history, past surgical history and problem list. Problem list updated.  Objective  Blood pressure 122/70, weight 164 lb (74.4 kg), last menstrual period 12/19/2016. Pregravid weight 148 lb (67.1 kg) Total Weight Gain 16 lb (7.258 kg) Urinalysis:      Fetal Status: Fetal Heart Rate (bpm): present   Movement: Absent     General:  Alert, oriented and cooperative. Patient is in no acute distress.  Skin: Skin is warm and dry. No rash noted.   Cardiovascular: Normal heart rate noted  Respiratory: Normal respiratory effort, no problems with respiration noted  Abdomen: Soft, gravid, appropriate for gestational age. Pain/Pressure: Absent     Pelvic:  Cervical exam deferred        Extremities: Normal range of motion.     Mental Status: Normal mood and affect. Normal behavior. Normal judgment and thought content.   Assessment   27 y.o. G1P0 at 3073w1d by  09/25/2017, by Last Menstrual Period presenting for routine prenatal visit  Plan   FIRST Problems (from 03/06/17 to present)    Problem Noted Resolved   Rh negative state in antepartum period 03/14/2017 by Conard NovakJackson, Jaimie Pippins D, MD No   Overview  Signed 03/14/2017  2:52 PM by Conard NovakJackson, Reagen Haberman D, MD    [ ]  rhogam 28 wks and pp prn      Supervision of normal first pregnancy, antepartum 03/06/2017 by Oswaldo ConroySchmid, Jacelyn Y, CNM No   Overview Addendum 03/14/2017  2:49 PM by Conard NovakJackson, Juanna Pudlo D, MD    Clinic [facility] Prenatal Labs  Dating  Blood type:     Genetic Screen 1 Screen:    AFP:     Quad:     NIPS: Antibody:   Anatomic US  Rubella:   Varicella:    GTT Early:               Third trimester:  RPR:     Rhogam  HBsAg:     TDaP vaccine                       Flu Shot: HIV:     Baby Food                                GBS:   Contraception  Pap:  CBB     CS/VBAC    Support Person                Please refer to After Visit Summary for other counseling recommendations.   Return in about 4 weeks (around 04/11/2017) for Routine Prenatal Appointment.  Thomasene MohairStephen Lailoni Baquera, MD  03/14/2017 3:15 PM

## 2017-03-16 LAB — HEMOGLOBINOPATHY EVALUATION
HGB C: 0 %
HGB S: 0 %
HGB VARIANT: 0 %
Hemoglobin A2 Quantitation: 2 % (ref 1.8–3.2)
Hemoglobin F Quantitation: 0 % (ref 0.0–2.0)
Hgb A: 98 % (ref 96.4–98.8)

## 2017-03-16 LAB — RPR+RH+ABO+RUB AB+AB SCR+CB...
ANTIBODY SCREEN: NEGATIVE
HEMOGLOBIN: 13 g/dL (ref 11.1–15.9)
HIV SCREEN 4TH GENERATION: NONREACTIVE
Hematocrit: 37.8 % (ref 34.0–46.6)
Hepatitis B Surface Ag: NEGATIVE
MCH: 31 pg (ref 26.6–33.0)
MCHC: 34.4 g/dL (ref 31.5–35.7)
MCV: 90 fL (ref 79–97)
PLATELETS: 222 10*3/uL (ref 150–379)
RBC: 4.2 x10E6/uL (ref 3.77–5.28)
RDW: 14.3 % (ref 12.3–15.4)
RPR: NONREACTIVE
RUBELLA: 1.45 {index} (ref >0.99)
Rh Factor: NEGATIVE
VARICELLA: 858 {index} (ref >165)
WBC: 6.4 10*3/uL (ref 3.4–10.8)

## 2017-03-17 LAB — FIRST TRIMESTER SCREEN W/NT
CRL: 64.9 mm
DIA MoM: 1.55
DIA Value: 338.8 pg/mL
GEST AGE-COLLECT: 12.7 wk
HCG MOM: 1.34
HCG VALUE: 107 [IU]/mL
MATERNAL AGE AT EDD: 29.3 a
NUCHAL TRANSLUCENCY: 1.4 mm
NUMBER OF FETUSES: 1
Nuchal Translucency MoM: 0.86
PAPP-A MoM: 1.25
PAPP-A Value: 1207.2 ng/mL
TEST RESULTS: NEGATIVE
Weight: 164 [lb_av]

## 2017-04-11 ENCOUNTER — Ambulatory Visit (INDEPENDENT_AMBULATORY_CARE_PROVIDER_SITE_OTHER): Payer: BLUE CROSS/BLUE SHIELD | Admitting: Obstetrics & Gynecology

## 2017-04-11 VITALS — BP 110/60 | Wt 162.0 lb

## 2017-04-11 DIAGNOSIS — Z6791 Unspecified blood type, Rh negative: Secondary | ICD-10-CM

## 2017-04-11 DIAGNOSIS — O09899 Supervision of other high risk pregnancies, unspecified trimester: Secondary | ICD-10-CM

## 2017-04-11 DIAGNOSIS — Z3400 Encounter for supervision of normal first pregnancy, unspecified trimester: Secondary | ICD-10-CM

## 2017-04-11 DIAGNOSIS — Z3A16 16 weeks gestation of pregnancy: Secondary | ICD-10-CM

## 2017-04-11 DIAGNOSIS — O26899 Other specified pregnancy related conditions, unspecified trimester: Secondary | ICD-10-CM

## 2017-04-11 NOTE — Progress Notes (Signed)
  Subjective  Fetal Movement? no Contractions? no Leaking Fluid? no Vaginal Bleeding? No Min nausea  Objective  BP 110/60   Wt 162 lb (73.5 kg)   LMP 12/19/2016   BMI 27.81 kg/m  General: NAD Pumonary: no increased work of breathing Abdomen: gravid, non-tender Extremities: no edema Psychiatric: mood appropriate, affect full  Assessment  27 y.o. G1P0 at 4688w1d by  09/25/2017, by Last Menstrual Period presenting for routine prenatal visit  Plan   Problem List Items Addressed This Visit      Other   Supervision of normal first pregnancy, antepartum   Rh negative state in antepartum period    Other Visit Diagnoses    [redacted] weeks gestation of pregnancy    -  Primary    US nv  Annamarie MajorPaul Harris, MD, Merlinda FrederickFACOG [facility], [facility] 04/11/2017  8:51 AM

## 2017-04-11 NOTE — Patient Instructions (Signed)

## 2017-05-07 ENCOUNTER — Encounter: Payer: Self-pay | Admitting: *Deleted

## 2017-05-07 ENCOUNTER — Observation Stay
Admission: EM | Admit: 2017-05-07 | Discharge: 2017-05-07 | Disposition: A | Discharge status: Home / Self Care | Payer: BLUE CROSS/BLUE SHIELD | Attending: Obstetrics and Gynecology | Admitting: Obstetrics and Gynecology

## 2017-05-07 ENCOUNTER — Observation Stay: Payer: BLUE CROSS/BLUE SHIELD

## 2017-05-07 DIAGNOSIS — O26892 Other specified pregnancy related conditions, second trimester: Secondary | ICD-10-CM | POA: Diagnosis not present

## 2017-05-07 DIAGNOSIS — R109 Unspecified abdominal pain: Secondary | ICD-10-CM

## 2017-05-07 DIAGNOSIS — R1033 Periumbilical pain: Secondary | ICD-10-CM | POA: Insufficient documentation

## 2017-05-07 DIAGNOSIS — O26899 Other specified pregnancy related conditions, unspecified trimester: Secondary | ICD-10-CM

## 2017-05-07 DIAGNOSIS — Z3A20 20 weeks gestation of pregnancy: Secondary | ICD-10-CM

## 2017-05-07 DIAGNOSIS — Z6791 Unspecified blood type, Rh negative: Secondary | ICD-10-CM

## 2017-05-07 DIAGNOSIS — Z3400 Encounter for supervision of normal first pregnancy, unspecified trimester: Secondary | ICD-10-CM

## 2017-05-07 LAB — URINALYSIS, COMPLETE (UACMP) WITH MICROSCOPIC
Bilirubin Urine: NEGATIVE
Glucose, UA: NEGATIVE mg/dL
Hgb urine dipstick: NEGATIVE
Ketones, ur: 5 mg/dL — AB
Leukocytes, UA: NEGATIVE
Nitrite: NEGATIVE
Protein, ur: NEGATIVE mg/dL
SPECIFIC GRAVITY, URINE: 1.008 (ref 1.005–1.030)
Squamous Epithelial / LPF: NONE SEEN
pH: 7 (ref 5.0–8.0)

## 2017-05-07 MED ORDER — ACETAMINOPHEN 500 MG PO TABS
ORAL_TABLET | ORAL | Status: AC
  Filled 2017-05-07: qty 2

## 2017-05-07 MED ORDER — ACETAMINOPHEN 500 MG PO TABS
1000.0000 mg | ORAL_TABLET | Freq: Four times a day (QID) | ORAL | Status: DC | PRN
  Administered 2017-05-07: 1000 mg via ORAL

## 2017-05-07 NOTE — OB Triage Note (Signed)
Pt arrived from home with complaints of constant abdominal pain at her umbilicus since 930 PM last night. Pt reports trying to "sleep it off" but unable to. Pt denies any leaking of fluid or vaginal bleeding. Pt abdomen is tender to touch at the umbilicus but soft. Doppler fetal heart tones obtained. TOCO applied.

## 2017-05-07 NOTE — Final Progress Note (Signed)
Final Progress Note 05/07/17  Sheila Fox is an 27 y.o. female.  HPI: Patient presented to labor and delivery complaining of severe umbilical pain since last night at 9pm. She has been in pain with ambulation. She denies vaginal bleeding. She denies abnormal vaginal discharge. She reports positive fetal movement. No nausea, vomiting, diarrhea, constipation of fever.  She has round ligament pain that was diagnosed in the office. She has not taken anything for the pain. Limited fetal US was normal.   OB History  Gravida Para Term Preterm AB Living  1            SAB TAB Ectopic Multiple Live Births               # Outcome Date GA Lbr Len/2nd Weight Sex Delivery Anes PTL Lv  1 Current              *Due date was changed today by CNM Tresea MallJane Gledhill   Past Medical History:  Diagnosis Date  . Hx of abnormal cervical Pap smear     Past Surgical History:  Procedure Laterality Date  . FOOT SURGERY Left 2007  . WISDOM TOOTH EXTRACTION  2009   all four    Family History  Problem Relation Age of Onset  . Hyperlipidemia Sister     Social History:  reports that  has never smoked. she has never used smokeless tobacco. She reports that she drinks alcohol. She reports that she does not use drugs.  Allergies: No Known Allergies  Medications: I have reviewed the patient's current medications.  Results for orders placed or performed during the hospital encounter of 05/07/17 (from the past 48 hour(s))  Urinalysis, Complete w Microscopic     Status: Abnormal   Collection Time: 05/07/17  5:38 AM  Result Value Ref Range   Color, Urine STRAW (A) YELLOW   APPearance CLEAR (A) CLEAR   Specific Gravity, Urine 1.008 1.005 - 1.030   pH 7.0 5.0 - 8.0   Glucose, UA NEGATIVE NEGATIVE mg/dL   Hgb urine dipstick NEGATIVE NEGATIVE   Bilirubin Urine NEGATIVE NEGATIVE   Ketones, ur 5 (A) NEGATIVE mg/dL   Protein, ur NEGATIVE NEGATIVE mg/dL   Nitrite NEGATIVE NEGATIVE   Leukocytes, UA NEGATIVE  NEGATIVE   RBC / HPF 0-5 0 - 5 RBC/hpf   WBC, UA 0-5 0 - 5 WBC/hpf   Bacteria, UA RARE (A) NONE SEEN   Squamous Epithelial / LPF NONE SEEN NONE SEEN   Mucus PRESENT     Comment: Performed at [facility], [address] [place], [place] [address]    [place] Ob Limited  Result Date: 05/07/2017 CLINICAL DATA:  Acute onset of periumbilical abdominal pain. EXAM: LIMITED OBSTETRIC ULTRASOUND FINDINGS: Number of Fetuses: 1 Heart Rate:  150 bpm Movement: Yes Presentation: Breech Placental Location: Posterior Previa: No Amniotic Fluid (Subjective):  Within normal limits. BPD: 4.92 cm 20 w  6 d MATERNAL FINDINGS: Cervix:  Appears closed. Uterus/Adnexae: No abnormality visualized. IMPRESSION: Single live intrauterine pregnancy noted, with a biparietal diameter of 4.9 cm, corresponding to a gestational age of [redacted] weeks 6 days. This matches the gestational age of [redacted] weeks 5 days by LMP, reflecting an estimated date of delivery of September 19, 2017. No evidence of placenta previa. The cervix remains closed. This exam is performed on an emergent basis and does not comprehensively evaluate fetal size, dating, or anatomy; follow-up complete OB US should be considered if further fetal assessment is warranted. Electronically  Signed   By: Roanna Raider M.D.   On: 05/07/2017 06:21    Review of Systems  Constitutional: Negative for chills, fever, malaise/fatigue and weight loss.  HENT: Negative for congestion, hearing loss and sinus pain.   Eyes: Negative for blurred vision and double vision.  Respiratory: Negative for cough, sputum production, shortness of breath and wheezing.   Cardiovascular: Negative for chest pain, palpitations, orthopnea and leg swelling.  Gastrointestinal: Positive for abdominal pain. Negative for constipation, diarrhea, nausea and vomiting.  Genitourinary: Negative for dysuria, flank pain, frequency, hematuria and urgency.  Musculoskeletal: Negative for back pain, falls and joint pain.   Skin: Negative for itching and rash.  Neurological: Negative for dizziness and headaches.  Psychiatric/Behavioral: Negative for depression, substance abuse and suicidal ideas. The patient is not nervous/anxious.    Blood pressure (!) 107/54, pulse 73, temperature 98.3 F (36.8 C), temperature source Axillary, resp. rate 18, height 5\' 3"  (1.6 m), weight 162 lb (73.5 kg), last menstrual period 12/13/2016. Physical Exam  Nursing note and vitals reviewed. Constitutional: She is oriented to person, place, and time. She appears well-developed and well-nourished.  HENT:  Head: Normocephalic and atraumatic.  Cardiovascular: Normal rate and regular rhythm.  Respiratory: Effort normal and breath sounds normal.  GI: Soft. Bowel sounds are normal.  Genitourinary:  Genitourinary Comments: Tender to the touch at her umbilicus.  Diffuse uterine tenderness. Tenderness with deviation of uterus to the left or right.   Musculoskeletal: Normal range of motion.  Neurological: [name], place, and time.  Skin: Skin is warm and dry.  Psychiatric: She has a normal mood and affect. Her behavior is normal. Judgment and thought content normal.    Assessment/Plan: 26yo G1P0 at 20 weks 5 days gestation. 1. Periumbilical pain, likely musculoskeletal. No signs or symptoms of abruption, chorioamnionitis, or appendicitis. Asked patient to take temperature twice a day at home are return if she develops a fever. Advised supportive care with tylenol, warm shower, ice, and rest.  Patient given a work note to be off until her office visit on 05/09/2017.  Jone Panebianco R Amear Strojny 05/07/2017, 7:53 AM

## 2017-05-07 NOTE — Discharge Summary (Signed)
Physician Discharge Summary   Patient ID: Sheila Fox [id number] 27 y.o. 1990-12-12  Admit date: 05/07/2017  Discharge date and time: No discharge date for patient encounter.   Admitting Physician: Natale Milchhristanna R Mcgwire Dasaro, MD   Discharge Physician: Adelene Idlerhristanna Raul Torrance MD  Admission Diagnoses: 20 wks preg abd cramps  Discharge Diagnoses: 20 weeks periumbilical pain  Admission Condition: good  Discharged Condition: good  Indication for Admission: Triage evaluation in pregnancy  Hospital Course: Patient was admitted for evaluation. She was seen by Obie DredgeNM Jane Gledhill who evaluated her and ordered and US and UA. UA negative, US reassuring.  Patient was given 1,000 mg of tylenol. Offered to observe her for another hour but she requested discharge home.   Consults: None  Significant Diagnostic Studies: see final progress ntoe  Treatments: tylenol  Discharge Exam: BP (!) 107/54   Pulse 73   Temp 98.3 F (36.8 C) (Axillary)   Resp 18   Ht 5\' 3"  (1.6 m)   Wt 162 lb (73.5 kg)   LMP 12/13/2016   BMI 28.70 kg/m   General Appearance:    Alert, cooperative, no distress, appears stated age  Head:    Normocephalic, without obvious abnormality, atraumatic  Eyes:    PERRL, conjunctiva/corneas clear, EOM's intact, fundi    benign, both eyes  Ears:    Normal TM's and external ear canals, both ears  Nose:   Nares normal, septum midline, mucosa normal, no drainage    or sinus tenderness  Throat:   Lips, mucosa, and tongue normal; teeth and gums normal  Neck:   Supple, symmetrical, trachea midline, no adenopathy;    thyroid:  no enlargement/tenderness/nodules; no carotid   bruit or JVD  Back:     Symmetric, no curvature, ROM normal, no CVA tenderness  Lungs:     Clear to auscultation bilaterally, respirations unlabored  Chest Wall:    No tenderness or deformity   Heart:    Regular rate and rhythm, S1 and S2 normal, no murmur, rub   or gallop  Breast Exam:    No tenderness, masses,  or nipple abnormality  Abdomen:     Soft, tender at umbilicus, bowel sounds active all four quadrants,    no masses, no organomegaly  Genitalia:    Normal female without lesion, discharge or tenderness  Rectal:    Normal tone, normal prostate, no masses or tenderness;   guaiac negative stool  Extremities:   Extremities normal, atraumatic, no cyanosis or edema  Pulses:   2+ and symmetric all extremities  Skin:   Skin color, texture, turgor normal, no rashes or lesions  Lymph nodes:   Cervical, supraclavicular, and axillary nodes normal  Neurologic:   CNII-XII intact, normal strength, sensation and reflexes    throughout    Disposition: 01-Home or Self Care  Patient Instructions:  Allergies as of 05/07/2017   No Known Allergies     Medication List    TAKE these medications   multivitamin-prenatal 27-0.8 MG Tabs tablet Take 1 tablet by mouth daily at 12 noon.      Activity: activity as tolerated Diet: regular diet Wound Care: none needed  Follow-up with [facility] in 2 days.  Signed: Natale MilchChristanna R Michole Lecuyer 05/07/2017 8:11 AM

## 2017-05-08 ENCOUNTER — Other Ambulatory Visit: Payer: Self-pay | Admitting: Obstetrics & Gynecology

## 2017-05-08 DIAGNOSIS — Z363 Encounter for antenatal screening for malformations: Secondary | ICD-10-CM

## 2017-05-09 ENCOUNTER — Ambulatory Visit (INDEPENDENT_AMBULATORY_CARE_PROVIDER_SITE_OTHER): Payer: BLUE CROSS/BLUE SHIELD | Admitting: Obstetrics and Gynecology

## 2017-05-09 ENCOUNTER — Encounter: Payer: Self-pay | Admitting: Obstetrics and Gynecology

## 2017-05-09 ENCOUNTER — Ambulatory Visit (INDEPENDENT_AMBULATORY_CARE_PROVIDER_SITE_OTHER): Payer: BLUE CROSS/BLUE SHIELD

## 2017-05-09 VITALS — BP 102/70 | Wt 166.0 lb

## 2017-05-09 DIAGNOSIS — Z363 Encounter for antenatal screening for malformations: Secondary | ICD-10-CM

## 2017-05-09 DIAGNOSIS — Z3400 Encounter for supervision of normal first pregnancy, unspecified trimester: Secondary | ICD-10-CM

## 2017-05-09 DIAGNOSIS — Z3A24 24 weeks gestation of pregnancy: Secondary | ICD-10-CM

## 2017-05-09 NOTE — Progress Notes (Signed)
Routine Prenatal Care Visit  Subjective  Sheila Fox is a 27 y.o. G1P0 at 725w0d being seen today for ongoing prenatal care.  [name] and has Supervision of normal first pregnancy, antepartum; Rh negative state in antepartum period; and Indication for care in labor and delivery, antepartum on their problem list.  ----------------------------------------------------------------------------------- Patient reports no complaints.  Patient says that umbilical pain has resolved. Contractions: Not present. Vag. Bleeding: None.  Movement: Present. Denies leaking of fluid.  ----------------------------------------------------------------------------------- The following portions of the patient's history were reviewed and updated as appropriate: allergies, current medications, past family history, past medical history, past social history, past surgical history and problem list. Problem list updated.   Objective  Blood pressure 102/70, weight 166 lb (75.3 kg), last menstrual period 12/13/2016. Pregravid weight 148 lb (67.1 kg) Total Weight Gain 18 lb (8.165 kg) Urinalysis: Urine Protein: Negative Urine Glucose: Negative  Fetal Status: Fetal Heart Rate (bpm): 164   Movement: Present     General:  Alert, oriented and cooperative. Patient is in no acute distress.  Skin: Skin is warm and dry. No rash noted.   Cardiovascular: Normal heart rate noted  Respiratory: Normal respiratory effort, no problems with respiration noted  Abdomen: Soft, gravid, appropriate for gestational age. Pain/Pressure: Absent     Pelvic:  Cervical exam deferred        Extremities: Normal range of motion.     ental Status: Normal mood and affect. Normal behavior. Normal judgment and thought content.     Assessment   27 y.o. G1P0 at 575w0d by  09/19/2017, by Last Menstrual Period presenting for routine prenatal visit  Plan   FIRST Problems  (from 03/06/17 to present)    Problem Noted Resolved   Rh negative state in antepartum period 03/14/2017 by Conard NovakJackson, Stephen D, MD No   Overview Signed 03/14/2017  2:52 PM by Conard NovakJackson, Stephen D, MD    [ ]  rhogam 28 wks and pp prn      Supervision of normal first pregnancy, antepartum 03/06/2017 by Oswaldo ConroySchmid, Jacelyn Y, CNM No   Overview Addendum 05/09/2017  9:53 AM by Natale MilchSchuman, Catalia Massett R, MD    Clinic [facility] Prenatal Labs  Dating L=12 Blood type: O/Negative/-- (01/15 1525)   Genetic Screen 1 Screen: neg   AFP:      Antibody:Negative (01/15 1525)  Anatomic US  Incomplete Rubella: 1.45 (01/15 1525) Varicella:    GTT Third trimester:  RPR: Non Reactive (01/15 1525)   Rhogam  HBsAg: Negative (01/15 1525)   TDaP vaccine                        Flu Shot: HIV: Non Reactive (01/15 1525)   Baby Food  Breast                              GBS:   Contraception  Given Information, considering IUD Pap:  CBB   Given information   CS/VBAC    Support Person                 Gestational age appropriate obstetric precautions including but not limited to vaginal bleeding, contractions, leaking of fluid and fetal movement were reviewed in detail with the patient.    Given information of Prenatal Classes Given Breast feeding book Given information on ordering breast pump Given information on cord blood  banking Given information on birth control options Ordered [place] for anatomy follow up  Ordered 28 week labs for next visit, will need rhogam. Last pap 2013 ASCUS HPV negative in 2013, complete at next visit.   Return in about 4 weeks (around 06/06/2017) for ROB and [place], GTT.  Adelene Idler MD [facility], [facility] 05/09/2017, 9:58 AM

## 2017-06-06 ENCOUNTER — Encounter: Payer: Self-pay | Admitting: Maternal Newborn

## 2017-06-06 ENCOUNTER — Ambulatory Visit (INDEPENDENT_AMBULATORY_CARE_PROVIDER_SITE_OTHER): Payer: BLUE CROSS/BLUE SHIELD

## 2017-06-06 ENCOUNTER — Ambulatory Visit (INDEPENDENT_AMBULATORY_CARE_PROVIDER_SITE_OTHER): Payer: BLUE CROSS/BLUE SHIELD | Admitting: Maternal Newborn

## 2017-06-06 VITALS — BP 120/60 | Wt 169.0 lb

## 2017-06-06 DIAGNOSIS — Z3A24 24 weeks gestation of pregnancy: Secondary | ICD-10-CM

## 2017-06-06 DIAGNOSIS — Z3A25 25 weeks gestation of pregnancy: Secondary | ICD-10-CM

## 2017-06-06 DIAGNOSIS — Z3400 Encounter for supervision of normal first pregnancy, unspecified trimester: Secondary | ICD-10-CM

## 2017-06-06 NOTE — Progress Notes (Signed)
C/o [email or web address]

## 2017-06-06 NOTE — Progress Notes (Signed)
Routine Prenatal Care Visit  Subjective  Sheila Fox is a 27 y.o. G1P0 at 1830w0d being seen today for ongoing prenatal care.  [name] and has Supervision of normal first pregnancy, antepartum; Rh negative state in antepartum period; and Indication for care in labor and delivery, antepartum on their problem list.  ----------------------------------------------------------------------------------- Patient reports heartburn.   Contractions: Not present. Vag. Bleeding: None.  Movement: Present. Denies leaking of fluid.  ----------------------------------------------------------------------------------- The following portions of the patient's history were reviewed and updated as appropriate: allergies, current medications, past family history, past medical history, past social history, past surgical history and problem list. Problem list updated.   Objective  Blood pressure 120/60, weight 169 lb (76.7 kg), last menstrual period 12/13/2016. Pregravid weight 148 lb (67.1 kg) Total Weight Gain 21 lb (9.526 kg) Urinalysis: Urine Protein: Negative Urine Glucose: Negative  Fetal Status: Fetal Heart Rate (bpm): 165   Movement: Present     General:  Alert, oriented and cooperative. Patient is in no acute distress.  Skin: Skin is warm and dry. No rash noted.   Cardiovascular: Normal heart rate noted  Respiratory: Normal respiratory effort, no problems with respiration noted  Abdomen: Soft, gravid, appropriate for gestational age. Pain/Pressure: Absent     Pelvic:  Cervical exam deferred        Extremities: Normal range of motion.     Mental Status: Normal mood and affect. Normal behavior. Normal judgment and thought content.     Assessment   27 y.o. G1P0 at 1930w0d, EDD 09/19/2017 by Last Menstrual Period presenting for routine prenatal visit.  Plan   FIRST Problems (from 03/06/17 to present)    Problem Noted  Resolved   Rh negative state in antepartum period 03/14/2017 by Conard NovakJackson, Stephen D, MD No   Overview Signed 03/14/2017  2:52 PM by Conard NovakJackson, Stephen D, MD    [ ]  rhogam 28 wks and pp prn      Supervision of normal first pregnancy, antepartum 03/06/2017 by Oswaldo ConroySchmid, Dianah Pruett Y, CNM No   Overview Addendum 05/09/2017  9:53 AM by Natale MilchSchuman, Christanna R, MD    [facility]inic [facility] Prenatal Labs  Dating L=12 Blood type: O/Negative/-- (01/15 1525)   Genetic Screen 1 Screen: neg   AFP:      Antibody:Negative (01/15 1525)  Anatomic US  Incomplete Rubella: 1.45 (01/15 1525) Varicella:    GTT Third trimester:  RPR: Non Reactive (01/15 1525)   Rhogam  HBsAg: Negative (01/15 1525)   TDaP vaccine                        Flu Shot: HIV: Non Reactive (01/15 1525)   Baby Food  Breast                              GBS:   Contraception  Given Information, considering IUD Pap: 10/07/2016, NILM  CBB   Given information   CS/VBAC    Support Person               Normal anatomy scan complete today. Gender still a surprise for now.  Advised Zantac for heartburn if Tums/calcium carbonate does not give relief.   Discussed GTT/labs/RhoGAM at 28 week visit. Also discussed Pap smear. She had a normal Pap on 10/07/2016 at [facility]; viewed electronic documentation of cytology result (NILM).  Preterm labor symptoms and general  obstetric precautions including but not limited to vaginal bleeding, contractions, leaking of fluid and fetal movement were reviewed with the patient.  Return in about 3 weeks (around 06/27/2017) for ROB with GTT/28 week labs.  Marcelyn Bruins, CNM 06/06/2017  10:09 AM

## 2017-06-27 ENCOUNTER — Ambulatory Visit (INDEPENDENT_AMBULATORY_CARE_PROVIDER_SITE_OTHER): Payer: BLUE CROSS/BLUE SHIELD | Admitting: Maternal Newborn

## 2017-06-27 ENCOUNTER — Encounter: Payer: Self-pay | Admitting: Maternal Newborn

## 2017-06-27 ENCOUNTER — Other Ambulatory Visit: Payer: BLUE CROSS/BLUE SHIELD

## 2017-06-27 VITALS — BP 130/60 | Wt 172.0 lb

## 2017-06-27 DIAGNOSIS — Z3400 Encounter for supervision of normal first pregnancy, unspecified trimester: Secondary | ICD-10-CM

## 2017-06-27 DIAGNOSIS — Z6791 Unspecified blood type, Rh negative: Secondary | ICD-10-CM

## 2017-06-27 DIAGNOSIS — Z3A28 28 weeks gestation of pregnancy: Secondary | ICD-10-CM

## 2017-06-27 DIAGNOSIS — O26893 Other specified pregnancy related conditions, third trimester: Secondary | ICD-10-CM

## 2017-06-27 MED ORDER — RHO D IMMUNE GLOBULIN 1500 UNIT/2ML IJ SOSY
300.0000 ug | PREFILLED_SYRINGE | Freq: Once | INTRAMUSCULAR | Status: AC
  Administered 2017-06-27: 300 ug via INTRAMUSCULAR

## 2017-06-27 NOTE — Progress Notes (Signed)
No concerns.rj 

## 2017-06-27 NOTE — Patient Instructions (Signed)
Third Trimester of Pregnancy The third trimester is from week 28 through week 40 (months 7 through 9). The third trimester is a time when the unborn baby (fetus) is growing rapidly. At the end of the ninth month, the fetus is about 20 inches in length and weighs 6-10 pounds. Body changes during your third trimester Your body will continue to go through many changes during pregnancy. The changes vary from woman to woman. During the third trimester:  Your weight will continue to increase. You can expect to gain 25-35 pounds (11-16 kg) by the end of the pregnancy.  You may begin to get stretch marks on your hips, abdomen, and breasts.  You may urinate more often because the fetus is moving lower into your pelvis and pressing on your bladder.  You may develop or continue to have heartburn. This is caused by increased hormones that slow down muscles in the digestive tract.  You may develop or continue to have constipation because increased hormones slow digestion and cause the muscles that push waste through your intestines to relax.  You may develop hemorrhoids. These are swollen veins (varicose veins) in the rectum that can itch or be painful.  You may develop swollen, bulging veins (varicose veins) in your legs.  You may have increased body aches in the pelvis, back, or thighs. This is due to weight gain and increased hormones that are relaxing your joints.  You may have changes in your hair. These can include thickening of your hair, rapid growth, and changes in texture. Some women also have hair loss during or after pregnancy, or hair that feels dry or thin. Your hair will most likely return to normal after your baby is born.  Your breasts will continue to grow and they will continue to become tender. A yellow fluid (colostrum) may leak from your breasts. This is the first milk you are producing for your baby.  Your belly button may stick out.  You may notice more swelling in your hands,  face, or ankles.  You may have increased tingling or numbness in your hands, arms, and legs. The skin on your belly may also feel numb.  You may feel short of breath because of your expanding uterus.  You may have more problems sleeping. This can be caused by the size of your belly, increased need to urinate, and an increase in your body's metabolism.  You may notice the fetus "dropping," or moving lower in your abdomen (lightening).  You may have increased vaginal discharge.  You may notice your joints feel loose and you may have pain around your pelvic bone.  What to expect at prenatal visits You will have prenatal exams every 2 weeks until week 36. Then you will have weekly prenatal exams. During a routine prenatal visit:  You will be weighed to make sure you and the baby are growing normally.  Your blood pressure will be taken.  Your abdomen will be measured to track your baby's growth.  The fetal heartbeat will be listened to.  Any test results from the previous visit will be discussed.  You may have a cervical check near your due date to see if your cervix has softened or thinned (effaced).  You will be tested for Group B streptococcus. This happens between 35 and 37 weeks.  Your health care provider may ask you:  What your birth plan is.  How you are feeling.  If you are feeling the baby move.  If you have had   any abnormal symptoms, such as leaking fluid, bleeding, severe headaches, or abdominal cramping.  If you are using any tobacco products, including cigarettes, chewing tobacco, and electronic cigarettes.  If you have any questions.  Other tests or screenings that may be performed during your third trimester include:  Blood tests that check for low iron levels (anemia).  Fetal testing to check the health, activity level, and growth of the fetus. Testing is done if you have certain medical conditions or if there are problems during the  pregnancy.  Nonstress test (NST). This test checks the health of your baby to make sure there are no signs of problems, such as the baby not getting enough oxygen. During this test, a belt is placed around your belly. The baby is made to move, and its heart rate is monitored during movement.  What is false labor? False labor is a condition in which you feel small, irregular tightenings of the muscles in the womb (contractions) that usually go away with rest, changing position, or drinking water. These are called Braxton Hicks contractions. Contractions may last for hours, days, or even weeks before true labor sets in. If contractions come at regular intervals, become more frequent, increase in intensity, or become painful, you should see your health care provider. What are the signs of labor?  Abdominal cramps.  Regular contractions that start at 10 minutes apart and become stronger and more frequent with time.  Contractions that start on the top of the uterus and spread down to the lower abdomen and back.  Increased pelvic pressure and dull back pain.  A watery or bloody mucus discharge that comes from the vagina.  Leaking of amniotic fluid. This is also known as your "water breaking." It could be a slow trickle or a gush. Let your health care provider know if it has a color or strange odor. If you have any of these signs, call your health care provider right away, even if it is before your due date. Follow these instructions at home: Medicines  Follow your health care provider's instructions regarding medicine use. Specific medicines may be either safe or unsafe to take during pregnancy.  Take a prenatal vitamin that contains at least 600 micrograms (mcg) of folic acid.  If you develop constipation, try taking a stool softener if your health care provider approves. Eating and drinking  Eat a balanced diet that includes fresh fruits and vegetables, whole grains, good sources of protein  such as meat, eggs, or tofu, and low-fat dairy. Your health care provider will help you determine the amount of weight gain that is right for you.  Avoid raw meat and uncooked cheese. These carry germs that can cause birth defects in the baby.  If you have low calcium intake from food, talk to your health care provider about whether you should take a daily calcium supplement.  Eat four or five small meals rather than three large meals a day.  Limit foods that are high in fat and processed sugars, such as fried and sweet foods.  To prevent constipation: ? Drink enough fluid to keep your urine clear or pale yellow. ? Eat foods that are high in fiber, such as fresh fruits and vegetables, whole grains, and beans. Activity  Exercise only as directed by your health care provider. Most women can continue their usual exercise routine during pregnancy. Try to exercise for 30 minutes at least 5 days a week. Stop exercising if you experience uterine contractions.  Avoid heavy   lifting.  Do not exercise in extreme heat or humidity, or at high altitudes.  Wear low-heel, comfortable shoes.  Practice good posture.  You may continue to have sex unless your health care provider tells you otherwise. Relieving pain and discomfort  Take frequent breaks and rest with your legs elevated if you have leg cramps or low back pain.  Take warm sitz baths to soothe any pain or discomfort caused by hemorrhoids. Use hemorrhoid cream if your health care provider approves.  Wear a good support bra to prevent discomfort from breast tenderness.  If you develop varicose veins: ? Wear support pantyhose or compression stockings as told by your healthcare provider. ? Elevate your feet for 15 minutes, 3-4 times a day. Prenatal care  Write down your questions. Take them to your prenatal visits.  Keep all your prenatal visits as told by your health care provider. This is important. Safety  Wear your seat belt at  all times when driving.  Make a list of emergency phone numbers, including numbers for family, friends, the hospital, and police and fire departments. General instructions  Avoid cat litter boxes and soil used by cats. These carry germs that can cause birth defects in the baby. If you have a cat, ask someone to clean the litter box for you.  Do not travel far distances unless it is absolutely necessary and only with the approval of your health care provider.  Do not use hot tubs, steam rooms, or saunas.  Do not drink alcohol.  Do not use any products that contain nicotine or tobacco, such as cigarettes and e-cigarettes. If you need help quitting, ask your health care provider.  Do not use any medicinal herbs or unprescribed drugs. These chemicals affect the formation and growth of the baby.  Do not douche or use tampons or scented sanitary pads.  Do not cross your legs for long periods of time.  To prepare for the arrival of your baby: ? Take prenatal classes to understand, practice, and ask questions about labor and delivery. ? Make a trial run to the hospital. ? Visit the hospital and tour the maternity area. ? Arrange for maternity or paternity leave through employers. ? Arrange for family and friends to take care of pets while you are in the hospital. ? Purchase a rear-facing car seat and make sure you know how to install it in your car. ? Pack your hospital bag. ? Prepare the baby's nursery. Make sure to remove all pillows and stuffed animals from the baby's crib to prevent suffocation.  Visit your dentist if you have not gone during your pregnancy. Use a soft toothbrush to brush your teeth and be gentle when you floss. Contact a health care provider if:  You are unsure if you are in labor or if your water has broken.  You become dizzy.  You have mild pelvic cramps, pelvic pressure, or nagging pain in your abdominal area.  You have lower back pain.  You have persistent  nausea, vomiting, or diarrhea.  You have an unusual or bad smelling vaginal discharge.  You have pain when you urinate. Get help right away if:  Your water breaks before 37 weeks.  You have regular contractions less than 5 minutes apart before 37 weeks.  You have a fever.  You are leaking fluid from your vagina.  You have spotting or bleeding from your vagina.  You have severe abdominal pain or cramping.  You have rapid weight loss or weight gain.    You have shortness of breath with chest pain.  You notice sudden or extreme swelling of your face, hands, ankles, feet, or legs.  Your baby makes fewer than 10 movements in 2 hours.  You have severe headaches that do not go away when you take medicine.  You have vision changes. Summary  The third trimester is from week 28 through week 40, months 7 through 9. The third trimester is a time when the unborn baby (fetus) is growing rapidly.  During the third trimester, your discomfort may increase as you and your baby continue to gain weight. You may have abdominal, leg, and back pain, sleeping problems, and an increased need to urinate.  During the third trimester your breasts will keep growing and they will continue to become tender. A yellow fluid (colostrum) may leak from your breasts. This is the first milk you are producing for your baby.  False labor is a condition in which you feel small, irregular tightenings of the muscles in the womb (contractions) that eventually go away. These are called Braxton Hicks contractions. Contractions may last for hours, days, or even weeks before true labor sets in.  Signs of labor can include: abdominal cramps; regular contractions that start at 10 minutes apart and become stronger and more frequent with time; watery or bloody mucus discharge that comes from the vagina; increased pelvic pressure and dull back pain; and leaking of amniotic fluid. This information is not intended to replace advice  given to you by your health care provider. Make sure you discuss any questions you have with your health care provider. Document Released: 02/08/2001 Document Revised: 07/23/2015 Document Reviewed: 04/17/2012 Elsevier Interactive Patient Education  2017 [employer]  

## 2017-06-27 NOTE — Progress Notes (Signed)
    Routine Prenatal Care Visit  Subjective  Sheila Fox is a 27 y.o. G1P0 at [redacted]w[redacted]d being seen today for ongoing prenatal care.  [name] and has Supervision of normal first pregnancy, antepartum and Rh negative state in antepartum period on their problem list.  ----------------------------------------------------------------------------------- Patient reports heartburn.  She is only experiencing it when she is supine, but she does have some occasional reflux and is developing a slight cough at times. Contractions: Not present. Vag. Bleeding: None.  Movement: Present. Denies leaking of fluid.  ----------------------------------------------------------------------------------- The following portions of the patient's history were reviewed and updated as appropriate: allergies, current medications, past family history, past medical history, past social history, past surgical history and problem list. Problem list updated.   Objective  Blood pressure 130/60, weight 172 lb (78 kg), last menstrual period 12/13/2016. Pregravid weight 148 lb (67.1 kg) Total Weight Gain 24 lb (10.9 kg) Urinalysis: No sample today Fetal Status: Fetal Heart Rate (bpm): 162 Fundal Height: 30 cm Movement: Present     General:  Alert, oriented and cooperative. Patient is in no acute distress.  Skin: Skin is warm and dry. No rash noted.   Cardiovascular: Normal heart rate noted  Respiratory: Normal respiratory effort, no problems with respiration noted  Abdomen: Soft, gravid, appropriate for gestational age. Pain/Pressure: Absent     Pelvic:  Cervical exam deferred        Extremities: Normal range of motion.  Edema: None  Mental Status: Normal mood and affect. Normal behavior. Normal judgment and thought content.     Assessment   27 y.o. G1P0 at [redacted]w[redacted]d, EDD 09/19/2017 by Last Menstrual Period presenting for routine prenatal visit.  Plan    FIRST Problems (from 03/06/17 to present)    Problem Noted Resolved   Rh negative state in antepartum period 03/14/2017 by Conard Novak, MD No   Overview Signed 03/14/2017  2:52 PM by Conard Novak, MD     rhogam 28 wks and pp prn      Supervision of normal first pregnancy, antepartum 03/06/2017 by Oswaldo Conroy, CNM No   Overview Addendum 06/06/2017 10:04 AM by Oswaldo Conroy, CNM    [facility]inic [facility] Prenatal Labs  Dating L=12 Blood type: O/Negative/-- (01/15 1525)   Genetic Screen 1 Screen: neg   AFP:      Antibody:Negative (01/15 1525)  Anatomic [place]  Incomplete Rubella: 1.45 (01/15 1525) Varicella:    GTT Third trimester:  RPR: Non Reactive (01/15 1525)   Rhogam  HBsAg: Negative (01/15 1525)   TDaP vaccine                        Flu Shot: HIV: Non Reactive (01/15 1525)   Baby Food  Breast                              GBS:   Contraception  Given Information, considering IUD Pap: 10/07/2016, NILM  CBB   Given information   CS/VBAC    Support Person               RhoGAM done today. Discussed importance of controlling acid reflux and trying OTC Tums/Zantac for relief.  Gestational age appropriate obstetric precautions were reviewed.  Return in about 2 weeks (around 07/11/2017) for ROB.  Marcelyn Bruins, CNM 06/27/2017  9:52 AM

## 2017-06-28 LAB — 28 WEEKS RH-PANEL
Antibody Screen: NEGATIVE
BASOS ABS: 0 10*3/uL (ref 0.0–0.2)
BASOS: 0 %
EOS (ABSOLUTE): 0.1 10*3/uL (ref 0.0–0.4)
EOS: 2 %
GESTATIONAL DIABETES SCREEN: 108 mg/dL (ref 65–139)
HEMATOCRIT: 37 % (ref 34.0–46.6)
HEMOGLOBIN: 12.1 g/dL (ref 11.1–15.9)
HIV SCREEN 4TH GENERATION: NONREACTIVE
Immature Grans (Abs): 0 10*3/uL (ref 0.0–0.1)
Immature Granulocytes: 1 %
LYMPHS ABS: 1 10*3/uL (ref 0.7–3.1)
Lymphs: 15 %
MCH: 30.6 pg (ref 26.6–33.0)
MCHC: 32.7 g/dL (ref 31.5–35.7)
MCV: 93 fL (ref 79–97)
Monocytes Absolute: 0.5 10*3/uL (ref 0.1–0.9)
Monocytes: 8 %
Neutrophils Absolute: 4.9 10*3/uL (ref 1.4–7.0)
Neutrophils: 74 %
PLATELETS: 214 10*3/uL (ref 150–379)
RBC: 3.96 x10E6/uL (ref 3.77–5.28)
RDW: 14.4 % (ref 12.3–15.4)
RPR: NONREACTIVE
WBC: 6.6 10*3/uL (ref 3.4–10.8)

## 2017-07-11 ENCOUNTER — Ambulatory Visit (INDEPENDENT_AMBULATORY_CARE_PROVIDER_SITE_OTHER): Payer: BLUE CROSS/BLUE SHIELD | Admitting: Certified Nurse Midwife

## 2017-07-11 VITALS — BP 102/62 | Wt 173.0 lb

## 2017-07-11 DIAGNOSIS — J069 Acute upper respiratory infection, unspecified: Secondary | ICD-10-CM

## 2017-07-11 DIAGNOSIS — Z3A30 30 weeks gestation of pregnancy: Secondary | ICD-10-CM | POA: Diagnosis not present

## 2017-07-11 DIAGNOSIS — Z23 Encounter for immunization: Secondary | ICD-10-CM | POA: Diagnosis not present

## 2017-07-11 DIAGNOSIS — Z3400 Encounter for supervision of normal first pregnancy, unspecified trimester: Secondary | ICD-10-CM

## 2017-07-11 NOTE — Progress Notes (Signed)
ROB at 30 weeks: BAby active.Complains of sneezing, rhinorrhea followed by sore throat, low grade fever (99), nasal congestion and ears feeling blocked. Started Zyrtec which has taken care of sneezing and sore throat and rhinorrhea, by still has nasal congestion. No sinus/facial pain or gum/tooth ache. Lungs CTA. Recommend: Sudafed 30 mgm q6hours prn. Can also use Vicks rub. RTO for fever>100, facial or sinus pain. [facility] at [facility], but prenatal classes were full as was the breast feeding class. Given pamphlet on childbirth TDAP today and signed BT consent RTO 2 weeks Preterm labor precautions Farrel Conners, CNM

## 2017-07-11 NOTE — Progress Notes (Signed)
Pt c/o allergies/cold sxs. Nasal congestion, sneezing, runny nose etc. Taking zyrtec with little relief. Tdap and blood consent today.

## 2017-07-25 ENCOUNTER — Ambulatory Visit (INDEPENDENT_AMBULATORY_CARE_PROVIDER_SITE_OTHER): Payer: BLUE CROSS/BLUE SHIELD | Admitting: Obstetrics and Gynecology

## 2017-07-25 VITALS — BP 106/60 | Wt 177.0 lb

## 2017-07-25 DIAGNOSIS — Z3400 Encounter for supervision of normal first pregnancy, unspecified trimester: Secondary | ICD-10-CM

## 2017-07-25 NOTE — Progress Notes (Signed)
    Routine Prenatal Care Visit  Subjective  Sheila Fox is a 27 y.o. G1P0 at [redacted]w[redacted]d being seen today for ongoing prenatal care.  [name] and has Supervision of normal first pregnancy, antepartum and Rh negative state in antepartum period on their problem list.  ----------------------------------------------------------------------------------- Patient reports no complaints.   Contractions: Not present. Vag. Bleeding: None.  Movement: Present. Denies leaking of fluid.  ----------------------------------------------------------------------------------- The following portions of the patient's history were reviewed and updated as appropriate: allergies, current medications, past family history, past medical history, past social history, past surgical history and problem list. Problem list updated.   Objective  Blood pressure 106/60, weight 177 lb (80.3 kg), last menstrual period 12/13/2016. Pregravid weight 148 lb (67.1 kg) Total Weight Gain 29 lb (13.2 kg) Urinalysis: Urine Protein: Negative Urine Glucose: Negative  Fetal Status: Fetal Heart Rate (bpm): 150   Movement: Present     General:  Alert, oriented and cooperative. Patient is in no acute distress.  Skin: Skin is warm and dry. No rash noted.   Cardiovascular: Normal heart rate noted  Respiratory: Normal respiratory effort, no problems with respiration noted  Abdomen: Soft, gravid, appropriate for gestational age. Pain/Pressure: Absent     Pelvic:  Cervical exam deferred        Extremities: Normal range of motion.     ental Status: Normal mood and affect. Normal behavior. Normal judgment and thought content.     Assessment   27 y.o. G1P0 at [redacted]w[redacted]d by  09/19/2017, by Last Menstrual Period presenting for routine prenatal visit  Plan   FIRST Problems (from 03/06/17 to present)    Problem Noted Resolved   Rh negative state in antepartum period 03/14/2017  by Conard Novak, MD No   Overview Signed 03/14/2017  2:52 PM by Conard Novak, MD     rhogam 28 wks and pp prn      Supervision of normal first pregnancy, antepartum 03/06/2017 by Oswaldo Conroy, CNM No   Overview Addendum 07/11/2017  9:35 AM by Farrel Conners, CNM    [facility] Prenatal Labs  Dating L=12 Blood type: O/Negative/-- (01/15 1525)   Genetic Screen 1 Screen: neg   AFP:      Antibody:Negative (01/15 1525)  Anatomic [place]  Incomplete Rubella: 1.45 (01/15 1525) Varicella:    GTT Third trimester: 108 RPR: Non Reactive (01/15 1525)   Rhogam Given 4/30 HBsAg: Negative (01/15 1525)   TDaP vaccine           07/11/17             Flu Shot: HIV: Non Reactive (01/15 1525)   Baby Food  Breast                              GBS:   Contraception  Given Information, considering IUD Pap: 10/07/2016, NILM  CBB   Given information   CS/VBAC    Support Person                  Gestational age appropriate obstetric precautions including but not limited to vaginal bleeding, contractions, leaking of fluid and fetal movement were reviewed in detail with the patient.    Return in about 2 weeks (around 08/08/2017) for ROB.  Vena Austria, MD, [facility], [facility] 07/25/2017, 1:12 PM

## 2017-07-25 NOTE — Progress Notes (Signed)
ROB

## 2017-08-08 ENCOUNTER — Ambulatory Visit (INDEPENDENT_AMBULATORY_CARE_PROVIDER_SITE_OTHER): Payer: BLUE CROSS/BLUE SHIELD | Admitting: Obstetrics and Gynecology

## 2017-08-08 ENCOUNTER — Encounter: Payer: Self-pay | Admitting: Obstetrics and Gynecology

## 2017-08-08 VITALS — BP 100/60 | Wt 176.0 lb

## 2017-08-08 DIAGNOSIS — Z3A34 34 weeks gestation of pregnancy: Secondary | ICD-10-CM

## 2017-08-08 DIAGNOSIS — Z3400 Encounter for supervision of normal first pregnancy, unspecified trimester: Secondary | ICD-10-CM

## 2017-08-08 NOTE — Progress Notes (Signed)
Pt reports no problems. Breast pump ordered today.

## 2017-08-08 NOTE — Progress Notes (Signed)
    Routine Prenatal Care Visit  Subjective  Sheila Fox is a 27 y.o. G1P0 at 5741w0d being seen today for ongoing prenatal care.  [name] and has Supervision of normal first pregnancy, antepartum and Rh negative state in antepartum period on their problem list.  ----------------------------------------------------------------------------------- Patient reports no complaints.   Contractions: Not present. Vag. Bleeding: None.  Movement: Present. Denies leaking of fluid.  ----------------------------------------------------------------------------------- The following portions of the patient's history were reviewed and updated as appropriate: allergies, current medications, past family history, past medical history, past social history, past surgical history and problem list. Problem list updated.   Objective  Blood pressure 100/60, weight 176 lb (79.8 kg), last menstrual period 12/13/2016. Pregravid weight 148 lb (67.1 kg) Total Weight Gain 28 lb (12.7 kg) Urinalysis: Urine Protein: Negative Urine Glucose: Negative  Fetal Status: Fetal Heart Rate (bpm): 150 Fundal Height: 34 cm Movement: Present  Presentation: Vertex  General:  Alert, oriented and cooperative. Patient is in no acute distress.  Skin: Skin is warm and dry. No rash noted.   Cardiovascular: Normal heart rate noted  Respiratory: Normal respiratory effort, no problems with respiration noted  Abdomen: Soft, gravid, appropriate for gestational age. Pain/Pressure: Absent     Pelvic:  Cervical exam deferred        Extremities: Normal range of motion.     ental Status: Normal mood and affect. Normal behavior. Normal judgment and thought content.     Assessment   27 y.o. G1P0 at 2841w0d by  09/19/2017, by Last Menstrual Period presenting for routine prenatal visit  Plan   FIRST Problems (from 03/06/17 to present)    Problem Noted Resolved   Rh  negative state in antepartum period 03/14/2017 by Conard NovakJackson, Stephen D, MD No   Overview Signed 03/14/2017  2:52 PM by Conard NovakJackson, Stephen D, MD    [ ]  rhogam 28 wks and pp prn      Supervision of normal first pregnancy, antepartum 03/06/2017 by Oswaldo ConroySchmid, Jacelyn Y, CNM No   Overview Addendum 08/08/2017  9:20 AM by Natale MilchSchuman, Daeveon Zweber R, MD    Clinic [facility] Prenatal Labs  Dating L=12 Blood type: O/Negative/-- (01/15 1525)   Genetic Screen 1 Screen: neg   AFP:      Antibody:Negative (01/15 1525)  Anatomic US  complete Rubella: 1.45 (01/15 1525) Varicella:    GTT Third trimester: 108 RPR: Non Reactive (01/15 1525)   Rhogam Given 4/30 HBsAg: Negative (01/15 1525)   TDaP vaccine           07/11/17             Flu Shot: HIV: Non Reactive (01/15 1525)   Baby Food  Breast                              GBS:   Contraception  Given Information, considering IUD Pap: 10/07/2016, NILM  CBB   Given information   CS/VBAC    Support Person                  Gestational age appropriate obstetric precautions including but not limited to vaginal bleeding, contractions, leaking of fluid and fetal movement were reviewed in detail with the patient.    Return in about 2 weeks (around 08/22/2017) for ROB.  Adelene Idlerhristanna Rebel Laughridge MD [facility] 08/08/17 9:22 AM

## 2017-08-22 ENCOUNTER — Ambulatory Visit (INDEPENDENT_AMBULATORY_CARE_PROVIDER_SITE_OTHER): Payer: BLUE CROSS/BLUE SHIELD | Admitting: Certified Nurse Midwife

## 2017-08-22 VITALS — BP 100/60 | Wt 175.0 lb

## 2017-08-22 DIAGNOSIS — Z3400 Encounter for supervision of normal first pregnancy, unspecified trimester: Secondary | ICD-10-CM

## 2017-08-22 DIAGNOSIS — Z3685 Encounter for antenatal screening for Streptococcus B: Secondary | ICD-10-CM

## 2017-08-22 DIAGNOSIS — O9989 Other specified diseases and conditions complicating pregnancy, childbirth and the puerperium: Secondary | ICD-10-CM

## 2017-08-22 DIAGNOSIS — Z3A36 36 weeks gestation of pregnancy: Secondary | ICD-10-CM

## 2017-08-22 DIAGNOSIS — R109 Unspecified abdominal pain: Secondary | ICD-10-CM

## 2017-08-22 NOTE — Patient Instructions (Signed)

## 2017-08-22 NOTE — Progress Notes (Signed)
ROB at 36 weeks: Doing well. Some lower abdominal cramping. No bleeding or leakage of fluid. Baby active. Received [facility] instructions. Breast/ Kyson GBS done Labor precautions ROB 1 week.  Sheila Fox, CNM

## 2017-08-22 NOTE — Progress Notes (Signed)
Pt c/o lower abdominal cramping.  

## 2017-08-24 LAB — STREP GP B NAA: Strep Gp B NAA: NEGATIVE

## 2017-08-29 ENCOUNTER — Ambulatory Visit (INDEPENDENT_AMBULATORY_CARE_PROVIDER_SITE_OTHER): Payer: BLUE CROSS/BLUE SHIELD | Admitting: Advanced Practice Midwife

## 2017-08-29 ENCOUNTER — Encounter: Payer: Self-pay | Admitting: Advanced Practice Midwife

## 2017-08-29 VITALS — BP 120/64 | Wt 181.0 lb

## 2017-08-29 DIAGNOSIS — Z3A37 37 weeks gestation of pregnancy: Secondary | ICD-10-CM

## 2017-08-29 NOTE — Patient Instructions (Signed)
Vaginal Delivery Vaginal delivery means that you will give birth by pushing your baby out of your birth canal (vagina). A team of health care providers will help you before, during, and after vaginal delivery. Birth experiences are unique for every woman and every pregnancy, and birth experiences vary depending on where you choose to give birth. What should I do to prepare for my baby's birth? Before your baby is born, it is important to talk with your health care provider about:  Your labor and delivery preferences. These may include: ? Medicines that you may be given. ? How you will manage your pain. This might include non-medical pain relief techniques or injectable pain relief such as epidural analgesia. ? How you and your baby will be monitored during labor and delivery. ? Who may be in the labor and delivery room with you. ? Your feelings about surgical delivery of your baby (cesarean delivery, or C-section) if this becomes necessary. ? Your feelings about receiving donated blood through an IV tube (blood transfusion) if this becomes necessary.  Whether you are able: ? To take pictures or videos of the birth. ? To eat during labor and delivery. ? To move around, walk, or change positions during labor and delivery.  What to expect after your baby is born, such as: ? Whether delayed umbilical cord clamping and cutting is offered. ? Who will care for your baby right after birth. ? Medicines or tests that may be recommended for your baby. ? Whether breastfeeding is supported in your hospital or birth center. ? How long you will be in the hospital or birth center.  How any medical conditions you have may affect your baby or your labor and delivery experience.  To prepare for your baby's birth, you should also:  Attend all of your health care visits before delivery (prenatal visits) as recommended by your health care provider. This is important.  Prepare your home for your baby's  arrival. Make sure that you have: ? Diapers. ? Baby clothing. ? Feeding equipment. ? Safe sleeping arrangements for you and your baby.  Install a car seat in your vehicle. Have your car seat checked by a certified car seat installer to make sure that it is installed safely.  Think about who will help you with your new baby at home for at least the first several weeks after delivery.  What can I expect when I arrive at the birth center or hospital? Once you are in labor and have been admitted into the hospital or birth center, your health care provider may:  Review your pregnancy history and any concerns you have.  Insert an IV tube into one of your veins. This is used to give you fluids and medicines.  Check your blood pressure, pulse, temperature, and heart rate (vital signs).  Check whether your bag of water (amniotic sac) has broken (ruptured).  Talk with you about your birth plan and discuss pain control options.  Monitoring Your health care provider may monitor your contractions (uterine monitoring) and your baby's heart rate (fetal monitoring). You may need to be monitored:  Often, but not continuously (intermittently).  All the time or for long periods at a time (continuously). Continuous monitoring may be needed if: ? You are taking certain medicines, such as medicine to relieve pain or make your contractions stronger. ? You have pregnancy or labor complications.  Monitoring may be done by:  Placing a special stethoscope or a handheld monitoring device on your abdomen to   check your baby's heartbeat, and feeling your abdomen for contractions. This method of monitoring does not continuously record your baby's heartbeat or your contractions.  Placing monitors on your abdomen (external monitors) to record your baby's heartbeat and the frequency and length of contractions. You may not have to wear external monitors all the time.  Placing monitors inside of your uterus  (internal monitors) to record your baby's heartbeat and the frequency, length, and strength of your contractions. ? Your health care provider may use internal monitors if he or she needs more information about the strength of your contractions or your baby's heart rate. ? Internal monitors are put in place by passing a thin, flexible wire through your vagina and into your uterus. Depending on the type of monitor, it may remain in your uterus or on your baby's head until birth. ? Your health care provider will discuss the benefits and risks of internal monitoring with you and will ask for your permission before inserting the monitors.  Telemetry. This is a type of continuous monitoring that can be done with external or internal monitors. Instead of having to stay in bed, you are able to move around during telemetry. Ask your health care provider if telemetry is an option for you.  Physical exam Your health care provider may perform a physical exam. This may include:  Checking whether your baby is positioned: ? With the head toward your vagina (head-down). This is most common. ? With the head toward the top of your uterus (head-up or breech). If your baby is in a breech position, your health care provider may try to turn your baby to a head-down position so you can deliver vaginally. If it does not seem that your baby can be born vaginally, your provider may recommend surgery to deliver your baby. In rare cases, you may be able to deliver vaginally if your baby is head-up (breech delivery). ? Lying sideways (transverse). Babies that are lying sideways cannot be delivered vaginally.  Checking your cervix to determine: ? Whether it is thinning out (effacing). ? Whether it is opening up (dilating). ? How low your baby has moved into your birth canal.  What are the three stages of labor and delivery?  Normal labor and delivery is divided into the following three stages: Stage 1  Stage 1 is the  longest stage of labor, and it can last for hours or days. Stage 1 includes: ? Early labor. This is when contractions may be irregular, or regular and mild. Generally, early labor contractions are more than 10 minutes apart. ? Active labor. This is when contractions get longer, more regular, more frequent, and more intense. ? The transition phase. This is when contractions happen very close together, are very intense, and may last longer than during any other part of labor.  Contractions generally feel mild, infrequent, and irregular at first. They get stronger, more frequent (about every 2-3 minutes), and more regular as you progress from early labor through active labor and transition.  Many women progress through stage 1 naturally, but you may need help to continue making progress. If this happens, your health care provider may talk with you about: ? Rupturing your amniotic sac if it has not ruptured yet. ? Giving you medicine to help make your contractions stronger and more frequent.  Stage 1 ends when your cervix is completely dilated to 4 inches (10 cm) and completely effaced. This happens at the end of the transition phase. Stage 2  Once   your cervix is completely effaced and dilated to 4 inches (10 cm), you may start to feel an urge to push. It is common for the body to naturally take a rest before feeling the urge to push, especially if you received an epidural or certain other pain medicines. This rest period may last for up to 1-2 hours, depending on your unique labor experience.  During stage 2, contractions are generally less painful, because pushing helps relieve contraction pain. Instead of contraction pain, you may feel stretching and burning pain, especially when the widest part of your baby's head passes through the vaginal opening (crowning).  Your health care provider will closely monitor your pushing progress and your baby's progress through the vagina during stage 2.  Your  health care provider may massage the area of skin between your vaginal opening and anus (perineum) or apply warm compresses to your perineum. This helps it stretch as the baby's head starts to crown, which can help prevent perineal tearing. ? In some cases, an incision may be made in your perineum (episiotomy) to allow the baby to pass through the vaginal opening. An episiotomy helps to make the opening of the vagina larger to allow more room for the baby to fit through.  It is very important to breathe and focus so your health care provider can control the delivery of your baby's head. Your health care provider may have you decrease the intensity of your pushing, to help prevent perineal tearing.  After delivery of your baby's head, the shoulders and the rest of the body generally deliver very quickly and without difficulty.  Once your baby is delivered, the umbilical cord may be cut right away, or this may be delayed for 1-2 minutes, depending on your baby's health. This may vary among health care providers, hospitals, and birth centers.  If you and your baby are healthy enough, your baby may be placed on your chest or abdomen to help maintain the baby's temperature and to help you bond with each other. Some mothers and babies start breastfeeding at this time. Your health care team will dry your baby and help keep your baby warm during this time.  Your baby may need immediate care if he or she: ? Showed signs of distress during labor. ? Has a medical condition. ? Was born too early (prematurely). ? Had a bowel movement before birth (meconium). ? Shows signs of difficulty transitioning from being inside the uterus to being outside of the uterus. If you are planning to breastfeed, your health care team will help you begin a feeding. Stage 3  The third stage of labor starts immediately after the birth of your baby and ends after you deliver the placenta. The placenta is an organ that develops  during pregnancy to provide oxygen and nutrients to your baby in the womb.  Delivering the placenta may require some pushing, and you may have mild contractions. Breastfeeding can stimulate contractions to help you deliver the placenta.  After the placenta is delivered, your uterus should tighten (contract) and become firm. This helps to stop bleeding in your uterus. To help your uterus contract and to control bleeding, your health care provider may: ? Give you medicine by injection, through an IV tube, by mouth, or through your rectum (rectally). ? Massage your abdomen or perform a vaginal exam to remove any blood clots that are left in your uterus. ? Empty your bladder by placing a thin, flexible tube (catheter) into your bladder. ? Encourage   you to breastfeed your baby. After labor is over, you and your baby will be monitored closely to ensure that you are both healthy until you are ready to go home. Your health care team will teach you how to care for yourself and your baby. This information is not intended to replace advice given to you by your health care provider. Make sure you discuss any questions you have with your health care provider. Document Released: 11/24/2007 Document Revised: 09/04/2015 Document Reviewed: 03/01/2015 Elsevier Interactive Patient Education  2018 [employer]  

## 2017-08-29 NOTE — Progress Notes (Signed)
  Routine Prenatal Care Visit  Subjective  Sheila Fox is a [name] being seen today for ongoing prenatal care.  [name] and has Supervision of normal first pregnancy, antepartum and Rh negative state in antepartum period on their problem list.  ----------------------------------------------------------------------------------- Patient reports no complaints.   Contractions: Not present. Vag. Bleeding: None.  Movement: Present. Denies leaking of fluid.  ----------------------------------------------------------------------------------- The following portions of the patient's history were reviewed and updated as appropriate: allergies, current medications, past family history, past medical history, past social history, past surgical history and problem list. Problem list updated.   Objective  Blood pressure 120/64, weight 181 lb (82.1 kg), last menstrual period 12/13/2016. Pregravid weight 148 lb (67.1 kg) Total Weight Gain 33 lb (15 kg) Urinalysis: Urine Protein: Negative Urine Glucose: Negative  Fetal Status: Fetal Heart Rate (bpm): 145 Fundal Height: 38 cm Movement: Present     General:  Alert, oriented and cooperative. Patient is in no acute distress.  Skin: Skin is warm and dry. No rash noted.   Cardiovascular: Normal heart rate noted  Respiratory: Normal respiratory effort, no problems with respiration noted  Abdomen: Soft, gravid, appropriate for gestational age. Pain/Pressure: Present     Pelvic:  Cervical exam deferred        Extremities: Normal range of motion.     Mental Status: Normal mood and affect. Normal behavior. Normal judgment and thought content.   Assessment   [name] by  09/19/2017, by Last Menstrual Period presenting for routine prenatal visit  Plan   FIRST Problems (from 03/06/17 to present)    Problem Noted Resolved   Rh negative state in antepartum  period 03/14/2017 by Conard NovakJackson, Stephen D, MD No   Overview Signed 03/14/2017  2:52 PM by Conard NovakJackson, Stephen D, MD    [ ]  rhogam 28 wks and pp prn      Supervision of normal first pregnancy, antepartum 03/06/2017 by Oswaldo ConroySchmid, Jacelyn Y, CNM No   Overview Addendum 08/08/2017  9:20 AM by Natale MilchSchuman, Christanna R, MD    [facility]inic [facility] Prenatal Labs  Dating L=12 Blood type: O/Negative/-- (01/15 1525)   Genetic Screen 1 Screen: neg   AFP:      Antibody:Negative (01/15 1525)  Anatomic US  complete Rubella: 1.45 (01/15 1525) Varicella:    GTT Third trimester: 108 RPR: Non Reactive (01/15 1525)   Rhogam Given 4/30 HBsAg: Negative (01/15 1525)   TDaP vaccine           07/11/17             Flu Shot: HIV: Non Reactive (01/15 1525)   Baby Food  Breast                              GBS:   Contraception  Given Information, considering IUD Pap: 10/07/2016, NILM  CBB   Given information   CS/VBAC    Support Person                  Term labor symptoms and general obstetric precautions including but not limited to vaginal bleeding, contractions, leaking of fluid and fetal movement were reviewed in detail with the patient. Please refer to After Visit Summary for other counseling recommendations.   Return in about 1 week (around 09/05/2017) for rob.  [name], CNM 08/29/2017 9:21 AM

## 2017-08-29 NOTE — Progress Notes (Signed)
ROB

## 2017-09-06 ENCOUNTER — Ambulatory Visit (INDEPENDENT_AMBULATORY_CARE_PROVIDER_SITE_OTHER): Payer: BLUE CROSS/BLUE SHIELD | Admitting: Advanced Practice Midwife

## 2017-09-06 ENCOUNTER — Encounter: Payer: Self-pay | Admitting: Advanced Practice Midwife

## 2017-09-06 VITALS — BP 132/78 | Wt 184.0 lb

## 2017-09-06 DIAGNOSIS — Z3A38 38 weeks gestation of pregnancy: Secondary | ICD-10-CM

## 2017-09-06 NOTE — Progress Notes (Signed)
  Routine Prenatal Care Visit  Subjective  Sheila Fox is a 27 y.o. G1P0 at 663w1d being seen today for ongoing prenatal care.  [name] and has Supervision of normal first pregnancy, antepartum and Rh negative state in antepartum period on their problem list.  ----------------------------------------------------------------------------------- Patient reports no complaints.   Contractions: Not present. Vag. Bleeding: None.  Movement: Present. Denies leaking of fluid.  ----------------------------------------------------------------------------------- The following portions of the patient's history were reviewed and updated as appropriate: allergies, current medications, past family history, past medical history, past social history, past surgical history and problem list. Problem list updated.   Objective  Blood pressure 132/78, weight 184 lb (83.5 kg), last menstrual period 12/13/2016. Pregravid weight 148 lb (67.1 kg) Total Weight Gain 36 lb (16.3 kg) Urinalysis: Urine Protein: Negative Urine Glucose: Negative  Fetal Status: Fetal Heart Rate (bpm): 154 Fundal Height: 39 cm Movement: Present     General:  Alert, oriented and cooperative. Patient is in no acute distress.  Skin: Skin is warm and dry. No rash noted.   Cardiovascular: Normal heart rate noted  Respiratory: Normal respiratory effort, no problems with respiration noted  Abdomen: Soft, gravid, appropriate for gestational age. Pain/Pressure: Present     Pelvic:  Cervical exam performed      cervix is posterior and difficult to reach  Extremities: Normal range of motion.  Edema: Trace  Mental Status: Normal mood and affect. Normal behavior. Normal judgment and thought content.   Assessment   27 y.o. G1P0 at 613w1d by  09/19/2017, by Last Menstrual Period presenting for routine prenatal visit  Plan   FIRST Problems (from 03/06/17 to present)    Problem Noted Resolved   Rh negative state in antepartum period 03/14/2017 by Conard NovakJackson, Stephen D, MD No   Overview Signed 03/14/2017  2:52 PM by Conard NovakJackson, Stephen D, MD    [ ]  rhogam 28 wks and pp prn      Supervision of normal first pregnancy, antepartum 03/06/2017 by Oswaldo ConroySchmid, Jacelyn Y, CNM No   Overview Addendum 08/08/2017  9:20 AM by Natale MilchSchuman, Christanna R, MD    [facility]inic [facility] Prenatal Labs  Dating L=12 Blood type: O/Negative/-- (01/15 1525)   Genetic Screen 1 Screen: neg   AFP:      Antibody:Negative (01/15 1525)  Anatomic US  complete Rubella: 1.45 (01/15 1525) Varicella:    GTT Third trimester: 108 RPR: Non Reactive (01/15 1525)   Rhogam Given 4/30 HBsAg: Negative (01/15 1525)   TDaP vaccine           07/11/17             Flu Shot: HIV: Non Reactive (01/15 1525)   Baby Food  Breast                              GBS:   Contraception  Given Information, considering IUD Pap: 10/07/2016, NILM  CBB   Given information   CS/VBAC    Support Person                  Term labor symptoms and general obstetric precautions including but not limited to vaginal bleeding, contractions, leaking of fluid and fetal movement were reviewed in detail with the patient.   Return in about 1 week (around 09/13/2017) for rob.  Tresea MallJane Kamarian Sahakian, CNM 09/06/2017 9:21 AM

## 2017-09-06 NOTE — Progress Notes (Signed)
ROB Some contractions no bleeding

## 2017-09-13 ENCOUNTER — Ambulatory Visit (INDEPENDENT_AMBULATORY_CARE_PROVIDER_SITE_OTHER): Payer: BLUE CROSS/BLUE SHIELD | Admitting: Obstetrics & Gynecology

## 2017-09-13 VITALS — BP 110/68 | Wt 183.0 lb

## 2017-09-13 DIAGNOSIS — Z3400 Encounter for supervision of normal first pregnancy, unspecified trimester: Secondary | ICD-10-CM

## 2017-09-13 DIAGNOSIS — Z3A39 39 weeks gestation of pregnancy: Secondary | ICD-10-CM

## 2017-09-13 DIAGNOSIS — Z3403 Encounter for supervision of normal first pregnancy, third trimester: Secondary | ICD-10-CM

## 2017-09-13 NOTE — Patient Instructions (Signed)
Braxton Hicks Contractions °Contractions of the uterus can occur throughout pregnancy, but they are not always a sign that you are in labor. You may have practice contractions called Braxton Hicks contractions. These false labor contractions are sometimes confused with true labor. °What are Braxton Hicks contractions? °Braxton Hicks contractions are tightening movements that occur in the muscles of the uterus before labor. Unlike true labor contractions, these contractions do not result in opening (dilation) and thinning of the cervix. Toward the end of pregnancy (32-34 weeks), Braxton Hicks contractions can happen more often and may become stronger. These contractions are sometimes difficult to tell apart from true labor because they can be very uncomfortable. You should not feel embarrassed if you go to the hospital with false labor. °Sometimes, the only way to tell if you are in true labor is for your health care provider to look for changes in the cervix. The health care provider will do a physical exam and may monitor your contractions. If you are not in true labor, the exam should show that your cervix is not dilating and your water has not broken. °If there are other health problems associated with your pregnancy, it is completely safe for you to be sent home with false labor. You may continue to have Braxton Hicks contractions until you go into true labor. °How to tell the difference between true labor and false labor °True labor °· Contractions last 30-70 seconds. °· Contractions become very regular. °· Discomfort is usually felt in the top of the uterus, and it spreads to the lower abdomen and low back. °· Contractions do not go away with walking. °· Contractions usually become more intense and increase in frequency. °· The cervix dilates and gets thinner. °False labor °· Contractions are usually shorter and not as strong as true labor contractions. °· Contractions are usually irregular. °· Contractions  are often felt in the front of the lower abdomen and in the groin. °· Contractions may go away when you walk around or change positions while lying down. °· Contractions get weaker and are shorter-lasting as time goes on. °· The cervix usually does not dilate or become thin. °Follow these instructions at home: °· Take over-the-counter and prescription medicines only as told by your health care provider. °· Keep up with your usual exercises and follow other instructions from your health care provider. °· Eat and drink lightly if you think you are going into labor. °· If Braxton Hicks contractions are making you uncomfortable: °? Change your position from lying down or resting to walking, or change from walking to resting. °? Sit and rest in a tub of warm water. °? Drink enough fluid to keep your urine pale yellow. Dehydration may cause these contractions. °? Do slow and deep breathing several times an hour. °· Keep all follow-up prenatal visits as told by your health care provider. This is important. °Contact a health care provider if: °· You have a fever. °· You have continuous pain in your abdomen. °Get help right away if: °· Your contractions become stronger, more regular, and closer together. °· You have fluid leaking or gushing from your vagina. °· You pass blood-tinged mucus (bloody show). °· You have bleeding from your vagina. °· You have low back pain that you never had before. °· You feel your baby’s head pushing down and causing pelvic pressure. °· Your baby is not moving inside you as much as it used to. °Summary °· Contractions that occur before labor are called Braxton   Hicks contractions, false labor, or practice contractions. °· Braxton Hicks contractions are usually shorter, weaker, farther apart, and less regular than true labor contractions. True labor contractions usually become progressively stronger and regular and they become more frequent. °· Manage discomfort from Braxton Hicks contractions by  changing position, resting in a warm bath, drinking plenty of water, or practicing deep breathing. °This information is not intended to replace advice given to you by your health care provider. Make sure you discuss any questions you have with your health care provider. °Document Released: 06/30/2016 Document Revised: 06/30/2016 Document Reviewed: 06/30/2016 °Elsevier Interactive Patient Education © 2018 Elsevier Inc. ° °

## 2017-09-13 NOTE — Progress Notes (Signed)
  Subjective  Fetal Movement? yes Contractions? Yes (irreg) w some pressure Leaking Fluid? no Vaginal Bleeding? no  Objective  BP 110/68   Wt 183 lb (83 kg)   LMP 12/13/2016   BMI 32.42 kg/m  General: NAD Pumonary: no increased work of breathing Abdomen: gravid, non-tender Extremities: no edema Psychiatric: mood appropriate, affect full  Assessment  27 y.o. G1P0 at 5370w1d by  09/19/2017, by Last Menstrual Period presenting for routine prenatal visit  Plan   Problem List Items Addressed This Visit      Other   Supervision of normal first pregnancy, antepartum    Other Visit Diagnoses    [redacted] weeks gestation of pregnancy    -  Primary    Labor precautions IOL 41 weeks discussed, pros and cons Recheck cervix next week  Annamarie MajorPaul Sherral Dirocco, MD, Merlinda FrederickFACOG [facility], [facility] 09/13/2017  9:06 AM

## 2017-09-18 ENCOUNTER — Encounter: Payer: Self-pay | Admitting: Maternal Newborn

## 2017-09-18 ENCOUNTER — Ambulatory Visit (INDEPENDENT_AMBULATORY_CARE_PROVIDER_SITE_OTHER): Payer: BLUE CROSS/BLUE SHIELD | Admitting: Maternal Newborn

## 2017-09-18 VITALS — BP 120/60 | Wt 182.0 lb

## 2017-09-18 DIAGNOSIS — O360130 Maternal care for anti-D [Rh] antibodies, third trimester, not applicable or unspecified: Secondary | ICD-10-CM

## 2017-09-18 DIAGNOSIS — Z3400 Encounter for supervision of normal first pregnancy, unspecified trimester: Secondary | ICD-10-CM

## 2017-09-18 DIAGNOSIS — Z3A39 39 weeks gestation of pregnancy: Secondary | ICD-10-CM

## 2017-09-18 NOTE — Progress Notes (Signed)
Routine Prenatal Care Visit  Subjective  Sheila Fox is a 27 y.o. G1P0 at 7725w6d being seen today for ongoing prenatal care.  [name] and has Supervision of normal first pregnancy, antepartum and Rh negative state in antepartum period on their problem list.  ----------------------------------------------------------------------------------- Patient reports no complaints.   Contractions: Irregular. Vag. Bleeding: None.  Movement: Present. No leaking of fluid.  ----------------------------------------------------------------------------------- The following portions of the patient's history were reviewed and updated as appropriate: allergies, current medications, past family history, past medical history, past social history, past surgical history and problem list. Problem list updated.   Objective  Blood pressure 120/60, weight 182 lb (82.6 kg), last menstrual period 12/13/2016. Pregravid weight 148 lb (67.1 kg) Total Weight Gain 34 lb (15.4 kg) Urinalysis: Urine Protein: Negative Urine Glucose: Negative  Fetal Status: Fetal Heart Rate (bpm): 155 Fundal Height: 39 cm Movement: Present  Presentation: Vertex  General:  Alert, oriented and cooperative. Patient is in no acute distress.  Skin: Skin is warm and dry. No rash noted.   Cardiovascular: Normal heart rate noted  Respiratory: Normal respiratory effort, no problems with respiration noted  Abdomen: Soft, gravid, appropriate for gestational age. Pain/Pressure: Present     Pelvic:  Cervical exam performed Dilation: Closed Effacement (%): 30 Station: -3  Extremities: Normal range of motion.  Edema: Mild pitting, slight indentation  Mental Status: Normal mood and affect. Normal behavior. Normal judgment and thought content.     Assessment   27 y.o. G1P0 at 3625w6d, EDD 09/19/2017 by Last Menstrual Period presenting for routine prenatal visit.  Plan    FIRST Problems (from 03/06/17 to present)    Problem Noted Resolved   Rh negative state in antepartum period 03/14/2017 by Conard NovakJackson, Stephen D, MD No   Overview Signed 03/14/2017  2:52 PM by Conard NovakJackson, Stephen D, MD    [ ]  rhogam 28 wks and pp prn      Supervision of normal first pregnancy, antepartum 03/06/2017 by Oswaldo ConroySchmid, Jacelyn Y, CNM No   Overview Addendum 09/10/2017  9:45 PM by Farrel ConnersGutierrez, Colleen, CNM    [facility] Prenatal Labs  Dating L=12 Blood type: O/Negative/-- (01/15 1525)   Genetic Screen 1 Screen: neg   AFP:      Antibody:Negative (01/15 1525)  Anatomic US  complete Rubella: 1.45 (01/15 1525) Varicella:    GTT Third trimester: 108 RPR: Non Reactive (01/15 1525)   Rhogam Given 4/30 HBsAg: Negative (01/15 1525)   TDaP vaccine           07/11/17             Flu Shot: HIV: Non Reactive (01/15 1525)   Baby Food  Breast                              GBS: negative  Contraception  Given Information, considering IUD Pap: 10/07/2016, NILM  CBB   Given information   CS/VBAC    Support Person               She would like to schedule an induction at 41 weeks.  Term labor symptoms and general obstetric precautions including but not limited to vaginal bleeding, contractions, leaking of fluid and fetal movement were reviewed.  Please refer to After Visit Summary for other counseling recommendations.   Return in about 1 week (around 09/25/2017) for ROB with NST.  Marcelyn Bruins, CNM 09/18/2017  9:38 AM

## 2017-09-18 NOTE — Progress Notes (Signed)
C/o schedule IOL next week; would like cx ck; how would she know if there isn't enough amniotic fluid to deliver naturally?  Right foot is more swollen than the left foot.rj

## 2017-09-18 NOTE — Patient Instructions (Signed)
Vaginal Delivery Vaginal delivery means that you will give birth by pushing your baby out of your birth canal (vagina). A team of health care providers will help you before, during, and after vaginal delivery. Birth experiences are unique for every woman and every pregnancy, and birth experiences vary depending on where you choose to give birth. What should I do to prepare for my baby's birth? Before your baby is born, it is important to talk with your health care provider about:  Your labor and delivery preferences. These may include: ? Medicines that you may be given. ? How you will manage your pain. This might include non-medical pain relief techniques or injectable pain relief such as epidural analgesia. ? How you and your baby will be monitored during labor and delivery. ? Who may be in the labor and delivery room with you. ? Your feelings about surgical delivery of your baby (cesarean delivery, or C-section) if this becomes necessary. ? Your feelings about receiving donated blood through an IV tube (blood transfusion) if this becomes necessary.  Whether you are able: ? To take pictures or videos of the birth. ? To eat during labor and delivery. ? To move around, walk, or change positions during labor and delivery.  What to expect after your baby is born, such as: ? Whether delayed umbilical cord clamping and cutting is offered. ? Who will care for your baby right after birth. ? Medicines or tests that may be recommended for your baby. ? Whether breastfeeding is supported in your hospital or birth center. ? How long you will be in the hospital or birth center.  How any medical conditions you have may affect your baby or your labor and delivery experience.  To prepare for your baby's birth, you should also:  Attend all of your health care visits before delivery (prenatal visits) as recommended by your health care provider. This is important.  Prepare your home for your baby's  arrival. Make sure that you have: ? Diapers. ? Baby clothing. ? Feeding equipment. ? Safe sleeping arrangements for you and your baby.  Install a car seat in your vehicle. Have your car seat checked by a certified car seat installer to make sure that it is installed safely.  Think about who will help you with your new baby at home for at least the first several weeks after delivery.  What can I expect when I arrive at the birth center or hospital? Once you are in labor and have been admitted into the hospital or birth center, your health care provider may:  Review your pregnancy history and any concerns you have.  Insert an IV tube into one of your veins. This is used to give you fluids and medicines.  Check your blood pressure, pulse, temperature, and heart rate (vital signs).  Check whether your bag of water (amniotic sac) has broken (ruptured).  Talk with you about your birth plan and discuss pain control options.  Monitoring Your health care provider may monitor your contractions (uterine monitoring) and your baby's heart rate (fetal monitoring). You may need to be monitored:  Often, but not continuously (intermittently).  All the time or for long periods at a time (continuously). Continuous monitoring may be needed if: ? You are taking certain medicines, such as medicine to relieve pain or make your contractions stronger. ? You have pregnancy or labor complications.  Monitoring may be done by:  Placing a special stethoscope or a handheld monitoring device on your abdomen to   check your baby's heartbeat, and feeling your abdomen for contractions. This method of monitoring does not continuously record your baby's heartbeat or your contractions.  Placing monitors on your abdomen (external monitors) to record your baby's heartbeat and the frequency and length of contractions. You may not have to wear external monitors all the time.  Placing monitors inside of your uterus  (internal monitors) to record your baby's heartbeat and the frequency, length, and strength of your contractions. ? Your health care provider may use internal monitors if he or she needs more information about the strength of your contractions or your baby's heart rate. ? Internal monitors are put in place by passing a thin, flexible wire through your vagina and into your uterus. Depending on the type of monitor, it may remain in your uterus or on your baby's head until birth. ? Your health care provider will discuss the benefits and risks of internal monitoring with you and will ask for your permission before inserting the monitors.  Telemetry. This is a type of continuous monitoring that can be done with external or internal monitors. Instead of having to stay in bed, you are able to move around during telemetry. Ask your health care provider if telemetry is an option for you.  Physical exam Your health care provider may perform a physical exam. This may include:  Checking whether your baby is positioned: ? With the head toward your vagina (head-down). This is most common. ? With the head toward the top of your uterus (head-up or breech). If your baby is in a breech position, your health care provider may try to turn your baby to a head-down position so you can deliver vaginally. If it does not seem that your baby can be born vaginally, your provider may recommend surgery to deliver your baby. In rare cases, you may be able to deliver vaginally if your baby is head-up (breech delivery). ? Lying sideways (transverse). Babies that are lying sideways cannot be delivered vaginally.  Checking your cervix to determine: ? Whether it is thinning out (effacing). ? Whether it is opening up (dilating). ? How low your baby has moved into your birth canal.  What are the three stages of labor and delivery?  Normal labor and delivery is divided into the following three stages: Stage 1  Stage 1 is the  longest stage of labor, and it can last for hours or days. Stage 1 includes: ? Early labor. This is when contractions may be irregular, or regular and mild. Generally, early labor contractions are more than 10 minutes apart. ? Active labor. This is when contractions get longer, more regular, more frequent, and more intense. ? The transition phase. This is when contractions happen very close together, are very intense, and may last longer than during any other part of labor.  Contractions generally feel mild, infrequent, and irregular at first. They get stronger, more frequent (about every 2-3 minutes), and more regular as you progress from early labor through active labor and transition.  Many women progress through stage 1 naturally, but you may need help to continue making progress. If this happens, your health care provider may talk with you about: ? Rupturing your amniotic sac if it has not ruptured yet. ? Giving you medicine to help make your contractions stronger and more frequent.  Stage 1 ends when your cervix is completely dilated to 4 inches (10 cm) and completely effaced. This happens at the end of the transition phase. Stage 2  Once   your cervix is completely effaced and dilated to 4 inches (10 cm), you may start to feel an urge to push. It is common for the body to naturally take a rest before feeling the urge to push, especially if you received an epidural or certain other pain medicines. This rest period may last for up to 1-2 hours, depending on your unique labor experience.  During stage 2, contractions are generally less painful, because pushing helps relieve contraction pain. Instead of contraction pain, you may feel stretching and burning pain, especially when the widest part of your baby's head passes through the vaginal opening (crowning).  Your health care provider will closely monitor your pushing progress and your baby's progress through the vagina during stage 2.  Your  health care provider may massage the area of skin between your vaginal opening and anus (perineum) or apply warm compresses to your perineum. This helps it stretch as the baby's head starts to crown, which can help prevent perineal tearing. ? In some cases, an incision may be made in your perineum (episiotomy) to allow the baby to pass through the vaginal opening. An episiotomy helps to make the opening of the vagina larger to allow more room for the baby to fit through.  It is very important to breathe and focus so your health care provider can control the delivery of your baby's head. Your health care provider may have you decrease the intensity of your pushing, to help prevent perineal tearing.  After delivery of your baby's head, the shoulders and the rest of the body generally deliver very quickly and without difficulty.  Once your baby is delivered, the umbilical cord may be cut right away, or this may be delayed for 1-2 minutes, depending on your baby's health. This may vary among health care providers, hospitals, and birth centers.  If you and your baby are healthy enough, your baby may be placed on your chest or abdomen to help maintain the baby's temperature and to help you bond with each other. Some mothers and babies start breastfeeding at this time. Your health care team will dry your baby and help keep your baby warm during this time.  Your baby may need immediate care if he or she: ? Showed signs of distress during labor. ? Has a medical condition. ? Was born too early (prematurely). ? Had a bowel movement before birth (meconium). ? Shows signs of difficulty transitioning from being inside the uterus to being outside of the uterus. If you are planning to breastfeed, your health care team will help you begin a feeding. Stage 3  The third stage of labor starts immediately after the birth of your baby and ends after you deliver the placenta. The placenta is an organ that develops  during pregnancy to provide oxygen and nutrients to your baby in the womb.  Delivering the placenta may require some pushing, and you may have mild contractions. Breastfeeding can stimulate contractions to help you deliver the placenta.  After the placenta is delivered, your uterus should tighten (contract) and become firm. This helps to stop bleeding in your uterus. To help your uterus contract and to control bleeding, your health care provider may: ? Give you medicine by injection, through an IV tube, by mouth, or through your rectum (rectally). ? Massage your abdomen or perform a vaginal exam to remove any blood clots that are left in your uterus. ? Empty your bladder by placing a thin, flexible tube (catheter) into your bladder. ? Encourage   you to breastfeed your baby. After labor is over, you and your baby will be monitored closely to ensure that you are both healthy until you are ready to go home. Your health care team will teach you how to care for yourself and your baby. This information is not intended to replace advice given to you by your health care provider. Make sure you discuss any questions you have with your health care provider. Document Released: 11/24/2007 Document Revised: 09/04/2015 Document Reviewed: 03/01/2015 Elsevier Interactive Patient Education  2018 [employer]  

## 2017-09-19 ENCOUNTER — Inpatient Hospital Stay: Admit: 2017-09-19 | Payer: Self-pay

## 2017-09-22 ENCOUNTER — Other Ambulatory Visit: Payer: Self-pay | Admitting: Maternal Newborn

## 2017-09-25 ENCOUNTER — Ambulatory Visit (INDEPENDENT_AMBULATORY_CARE_PROVIDER_SITE_OTHER): Payer: BLUE CROSS/BLUE SHIELD | Admitting: Obstetrics & Gynecology

## 2017-09-25 VITALS — BP 140/80 | Wt 186.0 lb

## 2017-09-25 DIAGNOSIS — O480 Post-term pregnancy: Secondary | ICD-10-CM | POA: Diagnosis not present

## 2017-09-25 DIAGNOSIS — Z3A40 40 weeks gestation of pregnancy: Secondary | ICD-10-CM | POA: Diagnosis not present

## 2017-09-25 DIAGNOSIS — Z3400 Encounter for supervision of normal first pregnancy, unspecified trimester: Secondary | ICD-10-CM

## 2017-09-25 NOTE — Patient Instructions (Signed)
Labor Induction Labor induction is when steps are taken to cause a pregnant woman to begin the labor process. Most women go into labor on their own between 37 weeks and 42 weeks of the pregnancy. When this does not happen or when there is a medical need, methods may be used to induce labor. Labor induction causes a pregnant woman's uterus to contract. It also causes the cervix to soften (ripen), open (dilate), and thin out (efface). Usually, labor is not induced before 39 weeks of the pregnancy unless there is a problem with the baby or mother. Before inducing labor, your health care provider will consider a number of factors, including the following:  The medical condition of you and the baby.  How many weeks along you are.  The status of the baby's lung maturity.  The condition of the cervix.  The position of the baby. What are the reasons for labor induction? Labor may be induced for the following reasons:  The health of the baby or mother is at risk.  The pregnancy is overdue by 1 week or more.  The water breaks but labor does not start on its own.  The mother has a health condition or serious illness, such as high blood pressure, infection, placental abruption, or diabetes.  The amniotic fluid amounts are low around the baby.  The baby is distressed. Convenience or wanting the baby to be born on a certain date is not a reason for inducing labor. What methods are used for labor induction? Several methods of labor induction may be used, such as:  Prostaglandin medicine. This medicine causes the cervix to dilate and ripen. The medicine will also start contractions. It can be taken by mouth or by inserting a suppository into the vagina.  Inserting a thin tube (catheter) with a balloon on the end into the vagina to dilate the cervix. Once inserted, the balloon is expanded with water, which causes the cervix to open.  Stripping the membranes. Your health care provider separates  amniotic sac tissue from the cervix, causing the cervix to be stretched and causing the release of a hormone called progesterone. This may cause the uterus to contract. It is often done during an office visit. You will be sent home to wait for the contractions to begin. You will then come in for an induction.  Breaking the water. Your health care provider makes a hole in the amniotic sac using a small instrument. Once the amniotic sac breaks, contractions should begin. This may still take hours to see an effect.  Medicine to trigger or strengthen contractions. This medicine is given through an IV access tube inserted into a vein in your arm. All of the methods of induction, besides stripping the membranes, will be done in the hospital. Induction is done in the hospital so that you and the baby can be carefully monitored. How long does it take for labor to be induced? Some inductions can take up to 2-3 days. Depending on the cervix, it usually takes less time. It takes longer when you are induced early in the pregnancy or if this is your first pregnancy. If a mother is still pregnant and the induction has been going on for 2-3 days, either the mother will be sent home or a cesarean delivery will be needed. What are the risks associated with labor induction? Some of the risks of induction include:  Changes in fetal heart rate, such as too high, too low, or erratic.  Fetal distress.    Chance of infection for the mother and baby.  Increased chance of having a cesarean delivery.  Breaking off (abruption) of the placenta from the uterus (rare).  Uterine rupture (very rare). When induction is needed for medical reasons, the benefits of induction may outweigh the risks. What are some reasons for not inducing labor? Labor induction should not be done if:  It is shown that your baby does not tolerate labor.  You have had previous surgeries on your uterus, such as a myomectomy or the removal of  fibroids.  Your placenta lies very low in the uterus and blocks the opening of the cervix (placenta previa).  Your baby is not in a head-down position.  The umbilical cord drops down into the birth canal in front of the baby. This could cut off the baby's blood and oxygen supply.  You have had a previous cesarean delivery.  There are unusual circumstances, such as the baby being extremely premature. This information is not intended to replace advice given to you by your health care provider. Make sure you discuss any questions you have with your health care provider. Document Released: 07/06/2006 Document Revised: 07/23/2015 Document Reviewed: 09/13/2012 Elsevier Interactive Patient Education  2017 Elsevier Inc.  

## 2017-09-25 NOTE — Progress Notes (Signed)
History and Physical  Sheila Fox is a 27 y.o. G1P0 7525w6d  for Induction of Labor scheduled due to Postdates .   See labor record for pregnancy highlights.  No recent pain, bleeding, ruptured membranes, or other signs of progressing labor.  PMHx: She  has a past medical history of abnormal cervical Pap smear. Also,  has a past surgical history that includes Foot surgery (Left, 2007) and Wisdom tooth extraction (2009)., family history includes Hyperlipidemia in her sister.,  reports that she has never smoked. She has never used smokeless tobacco. She reports that she drinks alcohol. She reports that she does not use drugs. She has a current medication list which includes the following prescription(s): cetirizine and multivitamin-prenatal. Also, has No Known Allergies. OB History  Gravida Para Term Preterm AB Living  1            SAB TAB Ectopic Multiple Live Births               # Outcome Date GA Lbr Len/2nd Weight Sex Delivery Anes PTL Lv  1 Current           Patient denies any other pertinent gynecologic issues.   Review of Systems  Constitutional: Negative for chills, fever and malaise/fatigue.  HENT: Negative for congestion, sinus pain and sore throat.   Eyes: Negative for blurred vision and pain.  Respiratory: Negative for cough and wheezing.   Cardiovascular: Negative for chest pain and leg swelling.  Gastrointestinal: Negative for abdominal pain, constipation, diarrhea, heartburn, nausea and vomiting.  Genitourinary: Negative for dysuria, frequency, hematuria and urgency.  Musculoskeletal: Negative for back pain, joint pain, myalgias and neck pain.  Skin: Negative for itching and rash.  Neurological: Negative for dizziness, tremors and weakness.  Endo/Heme/Allergies: Does not bruise/bleed easily.  Psychiatric/Behavioral: Negative for depression. The patient is not nervous/anxious and does not have insomnia.     Objective: BP 140/80   Wt 186 lb (84.4 kg)   LMP  12/13/2016   BMI 32.95 kg/m  Physical Exam  Vitals reviewed. Physical examination Constitutional NAD, Conversant  Skin No rashes, lesions or ulceration. Normal palpated skin turgor. No nodularity.  Lungs: Clear to auscultation.No rales or wheezes. Normal Respiratory effort, no retractions.  Heart: NSR.  No murmurs or rubs appreciated. No periferal edema  Abdomen: Gravid.  Non-tender.  No masses.  No HSM. No hernia  Extremities: Moves all appropriately.  Normal ROM for age. No lymphadenopathy.  Neuro: Grossly intact  Psych: Oriented to PPT.  Normal mood. Normal affect.     Pelvic:   Vulva: Normal appearance.  No lesions.  Vagina: No lesions or abnormalities noted.  Urethra No masses tenderness or scarring.  Meatus Normal size without lesions or prolapse.  Cervix: 1/70/-3.   Perineum: Normal exam.  No lesions.        Bimanual   Uterus: Enlarged.  Non-tender.    Adnexae: Not palpated.  Cul-de-sac: Negative for abnormality.  Nitrazine neg and fern neg today  A NST procedure was performed with FHR monitoring and a normal baseline established, appropriate time of 20-40 minutes of evaluation, and accels >2 seen w 15x15 characteristics.  Results show a REACTIVE NST.   Assessment: Term Pregnancy for Induction of Labor due to Postdates.  Plan: Patient will undergo induction of labor with cervical ripening agents.     Patient has been fully informed of the pros and cons, risks and benefits of continued observation with fetal monitoring versus that of induction of labor.  She understands that there are uncommon risks to induction, which include but are not limited to : frequent or prolonged uterine contractions, fetal distress, uterine rupture, and lack of successful induction.  These risks include all methods including Pitocin and Misoprostol and Cervadil.  Patient understands that using Misoprostol for labor induction is an "off label" indication although it has been studied extensively for  this purpose and is an accepted method of induction.  She also has been informed of the increased risks for Cesarean with induction and should induction not be successful.  Patient consents to the induction plan of management. GBS Neg Plans to breast/ feed Plans IUD for contraception TDaP UTD  Annamarie Major, MD, [facility], [facility] 09/25/2017  8:43 AM

## 2017-09-26 ENCOUNTER — Inpatient Hospital Stay
Admission: EM | Admit: 2017-09-26 | Discharge: 2017-09-30 | DRG: 787 | Disposition: A | Discharge status: Home / Self Care | Payer: BLUE CROSS/BLUE SHIELD | Attending: Certified Nurse Midwife | Admitting: Certified Nurse Midwife

## 2017-09-26 ENCOUNTER — Other Ambulatory Visit: Payer: Self-pay

## 2017-09-26 ENCOUNTER — Telehealth: Payer: Self-pay

## 2017-09-26 DIAGNOSIS — Z3400 Encounter for supervision of normal first pregnancy, unspecified trimester: Secondary | ICD-10-CM

## 2017-09-26 DIAGNOSIS — Z3A41 41 weeks gestation of pregnancy: Secondary | ICD-10-CM | POA: Diagnosis not present

## 2017-09-26 DIAGNOSIS — Z6791 Unspecified blood type, Rh negative: Secondary | ICD-10-CM

## 2017-09-26 DIAGNOSIS — D62 Acute posthemorrhagic anemia: Secondary | ICD-10-CM | POA: Diagnosis not present

## 2017-09-26 DIAGNOSIS — O480 Post-term pregnancy: Secondary | ICD-10-CM | POA: Diagnosis present

## 2017-09-26 DIAGNOSIS — O26899 Other specified pregnancy related conditions, unspecified trimester: Secondary | ICD-10-CM

## 2017-09-26 DIAGNOSIS — Z98891 History of uterine scar from previous surgery: Secondary | ICD-10-CM

## 2017-09-26 DIAGNOSIS — O9081 Anemia of the puerperium: Secondary | ICD-10-CM | POA: Diagnosis not present

## 2017-09-26 DIAGNOSIS — O4693 Antepartum hemorrhage, unspecified, third trimester: Secondary | ICD-10-CM | POA: Diagnosis present

## 2017-09-26 DIAGNOSIS — O339 Maternal care for disproportion, unspecified: Secondary | ICD-10-CM | POA: Diagnosis not present

## 2017-09-26 LAB — CBC
HCT: 37.6 % (ref 35.0–47.0)
HEMATOCRIT: 35.4 % (ref 35.0–47.0)
HEMOGLOBIN: 13.1 g/dL (ref 12.0–16.0)
Hemoglobin: 12.4 g/dL (ref 12.0–16.0)
MCH: 31.4 pg (ref 26.0–34.0)
MCH: 31.4 pg (ref 26.0–34.0)
MCHC: 34.9 g/dL (ref 32.0–36.0)
MCHC: 34.9 g/dL (ref 32.0–36.0)
MCV: 90 fL (ref 80.0–100.0)
MCV: 89.9 fL (ref 80.0–100.0)
PLATELETS: 202 10*3/uL (ref 150–440)
PLATELETS: 190 10*3/uL (ref 150–440)
RBC: 4.18 MIL/uL (ref 3.80–5.20)
RBC: 3.94 MIL/uL (ref 3.80–5.20)
RDW: 14.5 % (ref 11.5–14.5)
RDW: 14.2 % (ref 11.5–14.5)
WBC: 6.6 10*3/uL (ref 3.6–11.0)
WBC: 6.4 10*3/uL (ref 3.6–11.0)

## 2017-09-26 LAB — COMPREHENSIVE METABOLIC PANEL
ALBUMIN: 3.3 g/dL — AB (ref 3.5–5.0)
ALK PHOS: 285 U/L — AB (ref 38–126)
ALT: 28 U/L (ref 0–44)
ANION GAP: 7 (ref 5–15)
AST: 32 U/L (ref 15–41)
BUN: 6 mg/dL (ref 6–20)
CALCIUM: 9.1 mg/dL (ref 8.9–10.3)
CO2: 23 mmol/L (ref 22–32)
Chloride: 107 mmol/L (ref 98–111)
Creatinine, Ser: 0.74 mg/dL (ref 0.44–1.00)
GFR calc non Af Amer: >60 mL/min (ref >60)
GLUCOSE: 74 mg/dL (ref 70–99)
POTASSIUM: 3.5 mmol/L (ref 3.5–5.1)
SODIUM: 137 mmol/L (ref 135–145)
Total Bilirubin: 0.5 mg/dL (ref 0.3–1.2)
Total Protein: 6.8 g/dL (ref 6.5–8.1)

## 2017-09-26 LAB — PROTEIN / CREATININE RATIO, URINE
Creatinine, Urine: 65 mg/dL
PROTEIN CREATININE RATIO: 0.09 mg/mg{creat} (ref 0.00–0.15)
TOTAL PROTEIN, URINE: 6 mg/dL

## 2017-09-26 LAB — TYPE AND SCREEN
ABO/RH(D): O NEG
ANTIBODY SCREEN: NEGATIVE

## 2017-09-26 MED ORDER — SOD CITRATE-CITRIC ACID 500-334 MG/5ML PO SOLN
30.0000 mL | ORAL | Status: DC | PRN
  Administered 2017-09-26: 30 mL via ORAL
  Filled 2017-09-26 (×2): qty 15

## 2017-09-26 MED ORDER — OXYTOCIN 40 UNITS IN LACTATED RINGERS INFUSION - SIMPLE MED
1.0000 m[IU]/min | INTRAVENOUS | Status: DC
  Administered 2017-09-26 – 2017-09-27 (×2): 2 m[IU]/min via INTRAVENOUS
  Filled 2017-09-26 (×2): qty 1000

## 2017-09-26 MED ORDER — ACETAMINOPHEN 325 MG PO TABS: 650.0000 mg | ORAL_TABLET | ORAL | Status: DC | PRN

## 2017-09-26 MED ORDER — LACTATED RINGERS IV SOLN: 500.0000 mL | INTRAVENOUS | Status: DC | PRN

## 2017-09-26 MED ORDER — MISOPROSTOL 25 MCG QUARTER TABLET: 25.0000 ug | ORAL_TABLET | ORAL | Status: DC | PRN

## 2017-09-26 MED ORDER — OXYTOCIN BOLUS FROM INFUSION: 500.0000 mL | Freq: Once | INTRAVENOUS | Status: DC

## 2017-09-26 MED ORDER — LACTATED RINGERS IV SOLN
INTRAVENOUS | Status: DC
  Administered 2017-09-26: 19:00:00 via INTRAVENOUS

## 2017-09-26 MED ORDER — BUTORPHANOL TARTRATE 2 MG/ML IJ SOLN: 1.0000 mg | INTRAMUSCULAR | Status: DC | PRN

## 2017-09-26 MED ORDER — OXYTOCIN 10 UNIT/ML IJ SOLN
INTRAMUSCULAR | Status: AC
  Filled 2017-09-26: qty 2

## 2017-09-26 MED ORDER — TERBUTALINE SULFATE 1 MG/ML IJ SOLN: 0.2500 mg | Freq: Once | INTRAMUSCULAR | Status: DC | PRN

## 2017-09-26 MED ORDER — FENTANYL 2.5 MCG/ML W/ROPIVACAINE 0.15% IN NS 100 ML EPIDURAL (ARMC)
EPIDURAL | Status: AC
  Filled 2017-09-26: qty 100

## 2017-09-26 MED ORDER — ONDANSETRON HCL 4 MG/2ML IJ SOLN: 4.0000 mg | Freq: Four times a day (QID) | INTRAMUSCULAR | Status: DC | PRN

## 2017-09-26 MED ORDER — AMMONIA AROMATIC IN INHA
RESPIRATORY_TRACT | Status: DC
Start: 2017-09-26 — End: 2017-09-27
  Filled 2017-09-26: qty 10

## 2017-09-26 MED ORDER — LIDOCAINE HCL (PF) 1 % IJ SOLN
30.0000 mL | INTRAMUSCULAR | Status: DC | PRN
  Filled 2017-09-26: qty 30

## 2017-09-26 MED ORDER — OXYTOCIN 40 UNITS IN LACTATED RINGERS INFUSION - SIMPLE MED: 2.5000 [IU]/h | INTRAVENOUS | Status: DC

## 2017-09-26 MED ORDER — MISOPROSTOL 200 MCG PO TABS
ORAL_TABLET | ORAL | Status: AC
  Filled 2017-09-26: qty 4

## 2017-09-26 MED ORDER — LACTATED RINGERS IV SOLN
INTRAVENOUS | Status: DC
  Administered 2017-09-26 – 2017-09-27 (×2): via INTRAVENOUS

## 2017-09-26 MED ORDER — BUTORPHANOL TARTRATE 2 MG/ML IJ SOLN
1.0000 mg | INTRAMUSCULAR | Status: DC | PRN
  Administered 2017-09-26 – 2017-09-27 (×2): 1 mg via INTRAVENOUS
  Filled 2017-09-26 (×2): qty 1

## 2017-09-26 NOTE — Progress Notes (Signed)
   Subjective:  No concerns, feeling increasingly painful contractions  Objective:   Vitals: Blood pressure 126/61, pulse 69, temperature 98.8 F (37.1 C), temperature source Oral, resp. rate 16, height 5\' 3"  (1.6 m), weight 186 lb (84.4 kg), last menstrual period 12/13/2016, SpO2 100 %. General: NAD Abdomen: gravid, non-tender Cervical Exam:  Dilation: 3 Effacement (%): 70 Cervical Position: Posterior Station: -3 Presentation: Vertex Exam by:: Angelissa Supan MD  FHT: 150, moderate, +accels, no decels Toco: irregular  Results for orders placed or performed during the hospital encounter of 09/26/17 (from the past 24 hour(s))  Protein / creatinine ratio, urine     Status: None   Collection Time: 09/26/17  5:33 PM  Result Value Ref Range   Creatinine, Urine 65 mg/dL   Total Protein, Urine 6 mg/dL   Protein Creatinine Ratio 0.09 0.00 - 0.15 mg/mg[Cre]  CBC     Status: None   Collection Time: 09/26/17  6:42 PM  Result Value Ref Range   WBC 6.6 3.6 - 11.0 K/uL   RBC 4.18 3.80 - 5.20 MIL/uL   Hemoglobin 13.1 12.0 - 16.0 g/dL   HCT 21.337.6 08.635.0 - 57.847.0 %   MCV 90.0 80.0 - 100.0 fL   MCH 31.4 26.0 - 34.0 pg   MCHC 34.9 32.0 - 36.0 g/dL   RDW 46.914.5 62.911.5 - 52.814.5 %   Platelets 202 150 - 440 K/uL  Type and screen [facility]     Status: None   Collection Time: 09/26/17  6:42 PM  Result Value Ref Range   ABO/RH(D) O NEG    Antibody Screen NEG    Sample Expiration      09/29/2017 Performed at [facility], [address] [place], [place] [id number]   Comprehensive metabolic panel     Status: Abnormal   Collection Time: 09/26/17  6:42 PM  Result Value Ref Range   Sodium 137 135 - 145 mmol/L   Potassium 3.5 3.5 - 5.1 mmol/L   Chloride 107 98 - 111 mmol/L   CO2 23 22 - 32 mmol/L   Glucose, Bld 74 70 - 99 mg/dL   BUN 6 6 - 20 mg/dL   Creatinine, Ser 4.010.74 0.44 - 1.00 mg/dL   Calcium 9.1 8.9 - 02.710.3 mg/dL   Total Protein 6.8 6.5 - 8.1 g/dL   Albumin 3.3 (L)  3.5 - 5.0 g/dL   AST 32 15 - 41 U/L   ALT 28 0 - 44 U/L   Alkaline Phosphatase 285 (H) 38 - 126 U/L   Total Bilirubin 0.5 0.3 - 1.2 mg/dL   GFR calc non Af Amer >60 >60 mL/min   GFR calc Af Amer >60 >60 mL/min   Anion gap 7 5 - 15    Assessment:   27 y.o. G1P0 1733w0d   Plan:   1) Labor - continue to titrate pitocin, plan on AROM once cervix more anterior  2) Fetus - cat I tracing  Vena AustriaAndreas Caitlynne Harbeck, MD, Merlinda FrederickFACOG [facility], [facility] 09/26/2017, 8:49 PM

## 2017-09-26 NOTE — Telephone Encounter (Signed)
FMLA/DISABILITY form for [employer] filled out, signature obtained, and given to [place] for processing. 

## 2017-09-26 NOTE — OB Triage Note (Signed)
Pt presents c/o vaginal bleeding that started today. She noticed pink tinged when wiping. Pt states she has been having contractions today as well. Denies recent intercourse. Pt reports pain 6/10 in her lower abdomen described as cramping. Denies any LOF. Reports positive fetal movement. Vitals WNL. Will continue to monitor.

## 2017-09-26 NOTE — H&P (Signed)
Date of Initial H&P: 09/25/2017  History reviewed, patient examined, no change in status, presenting in term labor.

## 2017-09-27 ENCOUNTER — Encounter: Payer: Self-pay | Admitting: Anesthesiology

## 2017-09-27 ENCOUNTER — Inpatient Hospital Stay: Payer: BLUE CROSS/BLUE SHIELD | Admitting: Anesthesiology

## 2017-09-27 ENCOUNTER — Encounter
Admission: EM | Disposition: A | Discharge status: Home / Self Care | Payer: Self-pay | Source: Home / Self Care | Attending: Certified Nurse Midwife

## 2017-09-27 DIAGNOSIS — O480 Post-term pregnancy: Secondary | ICD-10-CM

## 2017-09-27 DIAGNOSIS — Z3A41 41 weeks gestation of pregnancy: Secondary | ICD-10-CM

## 2017-09-27 DIAGNOSIS — O339 Maternal care for disproportion, unspecified: Secondary | ICD-10-CM | POA: Diagnosis not present

## 2017-09-27 SURGERY — Surgical Case
Anesthesia: Epidural | Site: Abdomen | Wound class: Clean Contaminated

## 2017-09-27 MED ORDER — MORPHINE SULFATE (PF) 2 MG/ML IV SOLN
1.0000 mg | INTRAVENOUS | Status: DC | PRN
Start: 2017-09-27 — End: 2017-09-30

## 2017-09-27 MED ORDER — SODIUM CHLORIDE 0.9 % IV SOLN: 2.0000 g | Freq: Once | INTRAVENOUS | Status: DC

## 2017-09-27 MED ORDER — SIMETHICONE 80 MG PO CHEW
80.0000 mg | CHEWABLE_TABLET | Freq: Three times a day (TID) | ORAL | Status: DC
  Administered 2017-09-28 – 2017-09-30 (×8): 80 mg via ORAL
  Filled 2017-09-27 (×8): qty 1

## 2017-09-27 MED ORDER — PRENATAL MULTIVITAMIN CH
1.0000 | ORAL_TABLET | Freq: Every day | ORAL | Status: DC
  Administered 2017-09-28 – 2017-09-30 (×3): 1 via ORAL
  Filled 2017-09-27 (×3): qty 1

## 2017-09-27 MED ORDER — METHYLERGONOVINE MALEATE 0.2 MG/ML IJ SOLN
INTRAMUSCULAR | Status: AC
  Administered 2017-09-27: 0.2 mg
  Filled 2017-09-27: qty 1

## 2017-09-27 MED ORDER — BUPIVACAINE 0.25 % ON-Q PUMP DUAL CATH 400 ML
400.0000 mL | INJECTION | Status: DC
  Filled 2017-09-27: qty 400

## 2017-09-27 MED ORDER — LIDOCAINE-EPINEPHRINE (PF) 1.5 %-1:200000 IJ SOLN
INTRAMUSCULAR | Status: DC | PRN
  Administered 2017-09-27 (×2): 3 mL via PERINEURAL

## 2017-09-27 MED ORDER — SIMETHICONE 80 MG PO CHEW: 80.0000 mg | CHEWABLE_TABLET | ORAL | Status: DC | PRN

## 2017-09-27 MED ORDER — LACTATED RINGERS IV SOLN: INTRAVENOUS | Status: DC

## 2017-09-27 MED ORDER — EPHEDRINE 5 MG/ML INJ: 10.0000 mg | INTRAVENOUS | Status: DC | PRN

## 2017-09-27 MED ORDER — FENTANYL 2.5 MCG/ML W/ROPIVACAINE 0.15% IN NS 100 ML EPIDURAL (ARMC)
12.0000 mL/h | EPIDURAL | Status: DC
  Administered 2017-09-27: 12 mL/h via EPIDURAL
  Filled 2017-09-27 (×3): qty 100

## 2017-09-27 MED ORDER — BUPIVACAINE HCL 0.5 % IJ SOLN
INTRAMUSCULAR | Status: DC | PRN
  Administered 2017-09-27: 10 mL

## 2017-09-27 MED ORDER — ZOLPIDEM TARTRATE 5 MG PO TABS: 5.0000 mg | ORAL_TABLET | Freq: Every evening | ORAL | Status: DC | PRN

## 2017-09-27 MED ORDER — SIMETHICONE 80 MG PO CHEW
80.0000 mg | CHEWABLE_TABLET | ORAL | Status: DC
  Administered 2017-09-30: 80 mg via ORAL
  Filled 2017-09-27: qty 1

## 2017-09-27 MED ORDER — WITCH HAZEL-GLYCERIN EX PADS: 1.0000 "application " | MEDICATED_PAD | CUTANEOUS | Status: DC | PRN

## 2017-09-27 MED ORDER — BUPIVACAINE HCL (PF) 0.25 % IJ SOLN
INTRAMUSCULAR | Status: DC | PRN
  Administered 2017-09-27 (×4): 5 mL via EPIDURAL
  Administered 2017-09-27: 10 mL via EPIDURAL

## 2017-09-27 MED ORDER — FENTANYL 2.5 MCG/ML W/ROPIVACAINE 0.15% IN NS 100 ML EPIDURAL (ARMC)
EPIDURAL | Status: DC | PRN
  Administered 2017-09-27: 12 mL/h via EPIDURAL

## 2017-09-27 MED ORDER — LIDOCAINE HCL (PF) 1 % IJ SOLN
INTRAMUSCULAR | Status: DC | PRN
  Administered 2017-09-27: 3 mL

## 2017-09-27 MED ORDER — DIPHENHYDRAMINE HCL 25 MG PO CAPS: 25.0000 mg | ORAL_CAPSULE | Freq: Four times a day (QID) | ORAL | Status: DC | PRN

## 2017-09-27 MED ORDER — DIPHENHYDRAMINE HCL 50 MG/ML IJ SOLN: 12.5000 mg | INTRAMUSCULAR | Status: DC | PRN

## 2017-09-27 MED ORDER — BUPIVACAINE IN DEXTROSE 0.75-8.25 % IT SOLN
INTRATHECAL | Status: DC | PRN
  Administered 2017-09-27: 1.2 mL via INTRATHECAL

## 2017-09-27 MED ORDER — SODIUM CHLORIDE 0.9 % IV SOLN
500.0000 mg | Freq: Once | INTRAVENOUS | Status: AC
  Administered 2017-09-27: 500 mg via INTRAVENOUS
  Filled 2017-09-27: qty 500

## 2017-09-27 MED ORDER — SODIUM CHLORIDE 0.9 % IV SOLN
2.0000 g | INTRAVENOUS | Status: AC
  Administered 2017-09-27: 2 g via INTRAVENOUS

## 2017-09-27 MED ORDER — LIDOCAINE HCL (PF) 2 % IJ SOLN
INTRAMUSCULAR | Status: DC | PRN
  Administered 2017-09-27: 3 mL via EPIDURAL
  Administered 2017-09-27: 5 mL via INTRADERMAL

## 2017-09-27 MED ORDER — LACTATED RINGERS IV SOLN: 500.0000 mL | Freq: Once | INTRAVENOUS | Status: DC

## 2017-09-27 MED ORDER — OXYTOCIN 40 UNITS IN LACTATED RINGERS INFUSION - SIMPLE MED
INTRAVENOUS | Status: DC | PRN
  Administered 2017-09-27: 700 mL via INTRAVENOUS

## 2017-09-27 MED ORDER — DEXTROSE IN LACTATED RINGERS 5 % IV SOLN
INTRAVENOUS | Status: DC
  Administered 2017-09-27: 13:00:00 via INTRAVENOUS

## 2017-09-27 MED ORDER — SODIUM CHLORIDE 0.9 % IV SOLN
INTRAVENOUS | Status: AC
  Filled 2017-09-27: qty 2

## 2017-09-27 MED ORDER — SENNOSIDES-DOCUSATE SODIUM 8.6-50 MG PO TABS
2.0000 | ORAL_TABLET | ORAL | Status: DC
  Administered 2017-09-28 – 2017-09-30 (×2): 2 via ORAL
  Filled 2017-09-27 (×2): qty 2

## 2017-09-27 MED ORDER — SOD CITRATE-CITRIC ACID 500-334 MG/5ML PO SOLN
30.0000 mL | ORAL | Status: AC
  Administered 2017-09-27: 30 mL via ORAL

## 2017-09-27 MED ORDER — MORPHINE SULFATE (PF) 0.5 MG/ML IJ SOLN
INTRAMUSCULAR | Status: DC | PRN
  Administered 2017-09-27: .1 mg via INTRATHECAL

## 2017-09-27 MED ORDER — OXYTOCIN 40 UNITS IN LACTATED RINGERS INFUSION - SIMPLE MED
2.5000 [IU]/h | INTRAVENOUS | Status: AC
  Administered 2017-09-27: 2.5 [IU]/h via INTRAVENOUS
  Filled 2017-09-27: qty 1000

## 2017-09-27 MED ORDER — SODIUM CHLORIDE FLUSH 0.9 % IV SOLN
INTRAVENOUS | Status: AC
  Filled 2017-09-27: qty 10

## 2017-09-27 MED ORDER — FENTANYL CITRATE (PF) 100 MCG/2ML IJ SOLN
INTRAMUSCULAR | Status: DC | PRN
  Administered 2017-09-27: 15 ug via INTRATHECAL

## 2017-09-27 MED ORDER — MORPHINE SULFATE (PF) 0.5 MG/ML IJ SOLN
INTRAMUSCULAR | Status: AC
  Filled 2017-09-27: qty 10

## 2017-09-27 MED ORDER — BUPIVACAINE HCL (PF) 0.5 % IJ SOLN
INTRAMUSCULAR | Status: AC
  Filled 2017-09-27: qty 30

## 2017-09-27 MED ORDER — PHENYLEPHRINE HCL 10 MG/ML IJ SOLN
INTRAMUSCULAR | Status: DC | PRN
  Administered 2017-09-27: 50 ug/min via INTRAVENOUS

## 2017-09-27 MED ORDER — SODIUM CHLORIDE 0.9 % IV SOLN: 1.0000 g | INTRAVENOUS | Status: DC

## 2017-09-27 MED ORDER — FENTANYL CITRATE (PF) 100 MCG/2ML IJ SOLN
INTRAMUSCULAR | Status: AC
  Filled 2017-09-27: qty 2

## 2017-09-27 MED ORDER — BUPIVACAINE HCL 0.5 % IJ SOLN
10.0000 mL | Freq: Once | INTRAMUSCULAR | Status: DC
  Filled 2017-09-27: qty 10

## 2017-09-27 MED ORDER — COCONUT OIL OIL
1.0000 "application " | TOPICAL_OIL | Status: DC | PRN
  Administered 2017-09-28 – 2017-09-30 (×2): 1 via TOPICAL
  Filled 2017-09-27 (×2): qty 120

## 2017-09-27 MED ORDER — OXYCODONE-ACETAMINOPHEN 5-325 MG PO TABS
2.0000 | ORAL_TABLET | ORAL | Status: DC | PRN
  Administered 2017-09-29 (×2): 2 via ORAL
  Filled 2017-09-27 (×2): qty 2

## 2017-09-27 MED ORDER — PHENYLEPHRINE 40 MCG/ML (10ML) SYRINGE FOR IV PUSH (FOR BLOOD PRESSURE SUPPORT): 80.0000 ug | PREFILLED_SYRINGE | INTRAVENOUS | Status: DC | PRN

## 2017-09-27 MED ORDER — MENTHOL 3 MG MT LOZG
1.0000 | LOZENGE | OROMUCOSAL | Status: DC | PRN
  Filled 2017-09-27: qty 9

## 2017-09-27 MED ORDER — LACTATED RINGERS IV SOLN
INTRAVENOUS | Status: DC
  Administered 2017-09-27: 18:00:00 via INTRAVENOUS

## 2017-09-27 MED ORDER — KETOROLAC TROMETHAMINE 30 MG/ML IJ SOLN
30.0000 mg | Freq: Four times a day (QID) | INTRAMUSCULAR | Status: AC
  Administered 2017-09-27 – 2017-09-28 (×3): 30 mg via INTRAVENOUS
  Filled 2017-09-27 (×3): qty 1

## 2017-09-27 MED ORDER — ONDANSETRON HCL 4 MG/2ML IJ SOLN
INTRAMUSCULAR | Status: DC | PRN
  Administered 2017-09-27: 4 mg via INTRAVENOUS

## 2017-09-27 MED ORDER — DIBUCAINE 1 % RE OINT: 1.0000 "application " | TOPICAL_OINTMENT | RECTAL | Status: DC | PRN

## 2017-09-27 MED ORDER — ACETAMINOPHEN 325 MG PO TABS
650.0000 mg | ORAL_TABLET | ORAL | Status: DC | PRN
  Administered 2017-09-27 – 2017-09-28 (×2): 650 mg via ORAL
  Filled 2017-09-27 (×2): qty 2

## 2017-09-27 MED ORDER — OXYCODONE-ACETAMINOPHEN 5-325 MG PO TABS
1.0000 | ORAL_TABLET | ORAL | Status: DC | PRN
  Administered 2017-09-28 – 2017-09-30 (×6): 1 via ORAL
  Filled 2017-09-27 (×6): qty 1

## 2017-09-27 SURGICAL SUPPLY — 24 items
CANISTER SUCT 3000ML PPV (MISCELLANEOUS) ×3 IMPLANT
CATH KIT ON-Q SILVERSOAK 5IN (CATHETERS) ×6 IMPLANT
CHLORAPREP W/TINT 26ML (MISCELLANEOUS) ×6 IMPLANT
DERMABOND ADVANCED (GAUZE/BANDAGES/DRESSINGS) ×2
DERMABOND ADVANCED .7 DNX12 (GAUZE/BANDAGES/DRESSINGS) ×1 IMPLANT
DRSG OPSITE POSTOP 4X10 (GAUZE/BANDAGES/DRESSINGS) ×6 IMPLANT
ELECT CAUTERY BLADE 6.4 (BLADE) ×3 IMPLANT
ELECT REM PT RETURN 9FT ADLT (ELECTROSURGICAL) ×3
ELECTRODE REM PT RTRN 9FT ADLT (ELECTROSURGICAL) ×1 IMPLANT
GLOVE SKINSENSE NS SZ8.0 LF (GLOVE) ×2
GLOVE SKINSENSE STRL SZ8.0 LF (GLOVE) ×1 IMPLANT
GOWN STRL REUS W/ TWL LRG LVL3 (GOWN DISPOSABLE) ×1 IMPLANT
GOWN STRL REUS W/ TWL XL LVL3 (GOWN DISPOSABLE) ×2 IMPLANT
GOWN STRL REUS W/TWL LRG LVL3 (GOWN DISPOSABLE) ×2
GOWN STRL REUS W/TWL XL LVL3 (GOWN DISPOSABLE) ×4
NS IRRIG 1000ML POUR BTL (IV SOLUTION) ×3 IMPLANT
PACK C SECTION AR (MISCELLANEOUS) ×3 IMPLANT
PAD OB MATERNITY 4.3X12.25 (PERSONAL CARE ITEMS) ×3 IMPLANT
PAD PREP 24X41 OB/GYN DISP (PERSONAL CARE ITEMS) ×3 IMPLANT
SPONGE LAP 18X18 RF (DISPOSABLE) ×3 IMPLANT
SUT MAXON ABS #0 GS21 30IN (SUTURE) ×6 IMPLANT
SUT VIC AB 1 CT1 36 (SUTURE) ×12 IMPLANT
SUT VIC AB 2-0 CT1 36 (SUTURE) ×3 IMPLANT
SUT VIC AB 4-0 FS2 27 (SUTURE) ×3 IMPLANT

## 2017-09-27 NOTE — Progress Notes (Signed)
Subjective:  Comfortable  Objective:   Vitals: Blood pressure (!) 96/51, pulse 70, temperature 98.1 F (36.7 C), temperature source Oral, resp. rate 16, height 5\' 3"  (1.6 m), weight 186 lb (84.4 kg), last menstrual period 12/13/2016, SpO2 96 %. General: NAD Abdomen: Gravid, non-tender Cervical Exam:  Dilation: 4 Effacement (%): 100 Cervical Position: Middle Station: -2 Presentation: Vertex Exam by:: Rashidah [place] MD  AROM meconium  FHT: 150, moderate variability, +accels, no decels Toco: q2-46min  Results for orders placed or performed during the hospital encounter of 09/26/17 (from the past 24 hour(s))  Protein / creatinine ratio, urine     Status: None   Collection Time: 09/26/17  5:33 PM  Result Value Ref Range   Creatinine, Urine 65 mg/dL   Total Protein, Urine 6 mg/dL   Protein Creatinine Ratio 0.09 0.00 - 0.15 mg/mg[Cre]  CBC     Status: None   Collection Time: 09/26/17  6:42 PM  Result Value Ref Range   WBC 6.6 3.6 - 11.0 K/uL   RBC 4.18 3.80 - 5.20 MIL/uL   Hemoglobin 13.1 12.0 - 16.0 g/dL   HCT 16.1 09.6 - 04.5 %   MCV 90.0 80.0 - 100.0 fL   MCH 31.4 26.0 - 34.0 pg   MCHC 34.9 32.0 - 36.0 g/dL   RDW 40.9 81.1 - 91.4 %   Platelets 202 150 - 440 K/uL  Type and screen [facility]     Status: None   Collection Time: 09/26/17  6:42 PM  Result Value Ref Range   ABO/RH(D) O NEG    Antibody Screen NEG    Sample Expiration      09/29/2017 Performed at [facility], [address] [place], [place] [address]   Comprehensive metabolic panel     Status: Abnormal   Collection Time: 09/26/17  6:42 PM  Result Value Ref Range   Sodium 137 135 - 145 mmol/L   Potassium 3.5 3.5 - 5.1 mmol/L   Chloride 107 98 - 111 mmol/L   CO2 23 22 - 32 mmol/L   Glucose, Bld 74 70 - 99 mg/dL   BUN 6 6 - 20 mg/dL   Creatinine, Ser 6.21 0.44 - 1.00 mg/dL   Calcium 9.1 8.9 - 30.8 mg/dL   Total Protein 6.8 6.5 - 8.1 g/dL   Albumin 3.3 (L) 3.5 - 5.0 g/dL   AST 32 15 - 41 U/L   ALT 28 0 - 44 U/L   Alkaline Phosphatase 285 (H) 38 - 126 U/L   Total Bilirubin 0.5 0.3 - 1.2 mg/dL   GFR calc non Af Amer >60 >60 mL/min   GFR calc Af Amer >60 >60 mL/min   Anion gap 7 5 - 15  CBC     Status: None   Collection Time: 09/26/17  9:32 PM  Result Value Ref Range   WBC 6.4 3.6 - 11.0 K/uL   RBC 3.94 3.80 - 5.20 MIL/uL   Hemoglobin 12.4 12.0 - 16.0 g/dL   HCT 65.7 84.6 - 96.2 %   MCV 89.9 80.0 - 100.0 fL   MCH 31.4 26.0 - 34.0 pg   MCHC 34.9 32.0 - 36.0 g/dL   RDW 95.2 84.1 - 32.4 %   Platelets 190 150 - 440 K/uL    Assessment:   27 y.o. G1P0 [redacted]w[redacted]d IOL postdates  Plan:   1) Labor - continue pitocin, AROM - meconium  2) Fetus - cat I tracing  Vena Austria, MD, [address]  OB/GYN, [facility] 09/27/2017, 1:40 AM

## 2017-09-27 NOTE — Anesthesia Preprocedure Evaluation (Signed)
Anesthesia Evaluation  Patient identified by MRN, date of birth, ID band Patient awake    Reviewed: Allergy & Precautions, NPO status , Patient's Chart, lab work & pertinent test results  Airway Mallampati: II  TM Distance: >3 FB     Dental  (+) Teeth Intact   Pulmonary neg pulmonary ROS,    Pulmonary exam normal        Cardiovascular negative cardio ROS Normal cardiovascular exam     Neuro/Psych negative neurological ROS  negative psych ROS   GI/Hepatic negative GI ROS, Neg liver ROS,   Endo/Other  negative endocrine ROS  Renal/GU negative Renal ROS  negative genitourinary   Musculoskeletal negative musculoskeletal ROS (+)   Abdominal Normal abdominal exam  (+)   Peds negative pediatric ROS (+)  Hematology negative hematology ROS (+)   Anesthesia Other Findings   Reproductive/Obstetrics (+) Pregnancy                             Anesthesia Physical Anesthesia Plan  ASA: II  Anesthesia Plan: Epidural   Post-op Pain Management:    Induction:   PONV Risk Score and Plan:   Airway Management Planned: Natural Airway  Additional Equipment:   Intra-op Plan:   Post-operative Plan:   Informed Consent: I have reviewed the patients History and Physical, chart, labs and discussed the procedure including the risks, benefits and alternatives for the proposed anesthesia with the patient or authorized representative who has indicated his/her understanding and acceptance.   Dental advisory given  Plan Discussed with: CRNA and Surgeon  Anesthesia Plan Comments:         Anesthesia Quick Evaluation  

## 2017-09-27 NOTE — Anesthesia Post-op Follow-up Note (Signed)
Anesthesia QCDR form completed.        

## 2017-09-27 NOTE — Progress Notes (Signed)
Subjective:  Feeling increased pressure  Objective:   Vitals: Blood pressure 128/79, pulse 65, temperature 98.5 F (36.9 C), temperature source Oral, resp. rate 16, height 5\' 3"  (1.6 m), weight 186 lb (84.4 kg), last menstrual period 12/13/2016, SpO2 95 %. General: NAD Abdomen: soft, non-tender Cervical Exam:  Dilation: 5 Effacement (%): 100 Cervical Position: Middle Station: 0 Presentation: Vertex Exam by:: Kacen Mellinger MD  FHT: 145, moderate, +accels, no decels Toco: q2-643min  Results for orders placed or performed during the hospital encounter of 09/26/17 (from the past 24 hour(s))  Protein / creatinine ratio, urine     Status: None   Collection Time: 09/26/17  5:33 PM  Result Value Ref Range   Creatinine, Urine 65 mg/dL   Total Protein, Urine 6 mg/dL   Protein Creatinine Ratio 0.09 0.00 - 0.15 mg/mg[Cre]  CBC     Status: None   Collection Time: 09/26/17  6:42 PM  Result Value Ref Range   WBC 6.6 3.6 - 11.0 K/uL   RBC 4.18 3.80 - 5.20 MIL/uL   Hemoglobin 13.1 12.0 - 16.0 g/dL   HCT 96.037.6 45.435.0 - 09.847.0 %   MCV 90.0 80.0 - 100.0 fL   MCH 31.4 26.0 - 34.0 pg   MCHC 34.9 32.0 - 36.0 g/dL   RDW 11.914.5 14.711.5 - 82.914.5 %   Platelets 202 150 - 440 K/uL  Type and screen [facility]     Status: None   Collection Time: 09/26/17  6:42 PM  Result Value Ref Range   ABO/RH(D) O NEG    Antibody Screen NEG    Sample Expiration      09/29/2017 Performed at [facility], [address] [place], [place] [address]   Comprehensive metabolic panel     Status: Abnormal   Collection Time: 09/26/17  6:42 PM  Result Value Ref Range   Sodium 137 135 - 145 mmol/L   Potassium 3.5 3.5 - 5.1 mmol/L   Chloride 107 98 - 111 mmol/L   CO2 23 22 - 32 mmol/L   Glucose, Bld 74 70 - 99 mg/dL   BUN 6 6 - 20 mg/dL   Creatinine, Ser 0.860.74 0.44 - 1.00 mg/dL   Calcium 9.1 8.9 - 57.810.3 mg/dL   Total Protein 6.8 6.5 - 8.1 g/dL   Albumin 3.3 (L) 3.5 - 5.0 g/dL   AST 32 15 - 41 U/L     ALT 28 0 - 44 U/L   Alkaline Phosphatase 285 (H) 38 - 126 U/L   Total Bilirubin 0.5 0.3 - 1.2 mg/dL   GFR calc non Af Amer >60 >60 mL/min   GFR calc Af Amer >60 >60 mL/min   Anion gap 7 5 - 15  CBC     Status: None   Collection Time: 09/26/17  9:32 PM  Result Value Ref Range   WBC 6.4 3.6 - 11.0 K/uL   RBC 3.94 3.80 - 5.20 MIL/uL   Hemoglobin 12.4 12.0 - 16.0 g/dL   HCT 46.935.4 62.935.0 - 52.847.0 %   MCV 89.9 80.0 - 100.0 fL   MCH 31.4 26.0 - 34.0 pg   MCHC 34.9 32.0 - 36.0 g/dL   RDW 41.314.2 24.411.5 - 01.014.5 %   Platelets 190 150 - 440 K/uL    Assessment:   27 y.o. G1P0 4763w1d postdates IOL  Plan:   1) Labor - some minimal change in dilation but further descent and effacement.  IUPC placed.  On 18 milliunits of pitocin,  if unable to get to adequate we discussed allowing pitocin receptors to wash out re-titrating pitocin.  Should we get to 20 milliunits without adequate contraction we discussed going up to 30 milliunits of pitocin.  2) Fetus - Cat I tracing  Vena Austria, MD, [facility], [facility] 09/27/2017, 5:48 AM

## 2017-09-27 NOTE — Transfer of Care (Signed)
Immediate Anesthesia Transfer of Care Note  Patient: Sheila Fox  Procedure(s) Performed: CESAREAN SECTION (N/A Abdomen)  Patient Location: PACU  Anesthesia Type:Epidural  Level of Consciousness: awake, alert , oriented and patient cooperative  Airway & Oxygen Therapy: Patient Spontanous Breathing  Post-op Assessment: Report given to RN and Post -op Vital signs reviewed and stable  Post vital signs: Reviewed and stable  Last Vitals:  Vitals Value Taken Time  BP    Temp    Pulse    Resp    SpO2      Last Pain:  Vitals:   09/27/17 1742  TempSrc: Oral  PainSc:       Patients Stated Pain Goal: 1 (09/27/17 0441)  Complications: No apparent anesthesia complications

## 2017-09-27 NOTE — Discharge Summary (Signed)
OB Discharge Summary     Patient Name: Sheila Fox DOB: [birth year] MRN: [id number]  Date of admission: 09/26/2017 Delivering MD: Letitia Libraobert Paul Harris, MD  Date of Delivery: 09/27/2017  Date of discharge: 09/30/2017  Admitting diagnosis: 41 weeks preg contractions Intrauterine pregnancy: 2416w1d     Secondary diagnosis: Arrest of dilation due to cephalopelvic dysproportion     Discharge diagnosis: Term Pregnancy Delivered, Reasons for cesarean section  Arrest of Dilation and CPD                         Hospital course:  Induction of Labor With Cesarean Section  27 y.o. yo G1P1001 at 4916w1d was admitted to the hospital 09/26/2017 for induction of labor. Patient had a labor course significant for Cytotec, AROM, Pitocin, Epidural. The patient went for cesarean section due to Arrest of Dilation and CPD (6 cm dialyet with no further progress), and delivered a Viable infant,09/27/2017  Membrane Rupture Time/Date: 1:31 AM ,09/27/2017   Details of operation can be found in separate operative Note.  Patient had an uncomplicated postpartum course. She is ambulating, tolerating a regular diet, passing flatus, and urinating well.  Patient is discharged home in stable condition on 09/30/17.                                                                                                   Post partum procedures:none  Complications: None  Physical exam on 09/30/2017: Vitals:   09/29/17 0800 09/29/17 1653 09/30/17 0005 09/30/17 0731  BP: 121/78 118/69 122/72 (!) 126/96  Pulse: 94 77 70 60  Resp: 18 18 18 20   Temp: 98.2 F (36.8 C) 98.8 F (37.1 C) 98.1 F (36.7 C) 98 F (36.7 C)  TempSrc: Oral Oral Oral   SpO2: 100%  100% 99%  Weight:      Height:       General: alert and no distress Lochia: appropriate Uterine Fundus: firm Incision: Healing well with no significant drainage DVT Evaluation: No evidence of DVT seen on physical exam.  Labs: Lab Results  Component Value Date   WBC 9.7  09/28/2017   HGB 9.7 (L) 09/28/2017   HCT 27.2 (L) 09/28/2017   MCV 90.6 09/28/2017   PLT 152 09/28/2017   CMP Latest Ref Rng & Units 09/26/2017  Glucose 70 - 99 mg/dL 74  BUN 6 - 20 mg/dL 6  Creatinine 4.090.44 - 8.111.00 mg/dL 9.140.74  Sodium 782135 - 956145 mmol/L 137  Potassium 3.5 - 5.1 mmol/L 3.5  Chloride 98 - 111 mmol/L 107  CO2 22 - 32 mmol/L 23  Calcium 8.9 - 10.3 mg/dL 9.1  Total Protein 6.5 - 8.1 g/dL 6.8  Total Bilirubin 0.3 - 1.2 mg/dL 0.5  Alkaline Phos 38 - 126 U/L 285(H)  AST 15 - 41 U/L 32  ALT 0 - 44 U/L 28    Discharge instruction: per After Visit Summary.  Medications:  Allergies as of 09/30/2017   No Known Allergies     Medication List    TAKE these medications   cetirizine 10 MG  tablet Commonly known as:  ZYRTEC Take 10 mg by mouth daily.   ibuprofen 600 MG tablet Commonly known as:  ADVIL,MOTRIN Take 1 tablet (600 mg total) by mouth every 6 (six) hours.   multivitamin-prenatal 27-0.8 MG Tabs tablet Take 1 tablet by mouth daily at 12 noon.   oxyCODONE-acetaminophen 5-325 MG tablet Commonly known as:  PERCOCET/ROXICET Take 1 tablet by mouth every 4 (four) hours as needed (pain scale 4-7).            Discharge Care Instructions  (From admission, onward)        Start     Ordered   09/30/17 0000  Discharge wound care:    Comments:  You may apply a light dressing for minor discharge from the incision or to keep waistbands of clothing from rubbing.  You may also have been discharge with a clear dressing in which case this will be removed at your postoperative clinic visit.  You may shower, use soap on your incision.  Avoid baths or soaking the incision in the first 6 weeks following your surgery.Marland Kitchen   09/30/17 0809      Diet: routine diet  Activity: Advance as tolerated. Pelvic rest for 6 weeks.   Outpatient follow up: Follow-up Information    Nadara Mustard, MD. Schedule an appointment as soon as possible for a visit in 2 week(s).   Specialty:   Obstetrics and Gynecology Why:  For post op check Contact information: [address] [place] [address] [phone]             Postpartum contraception: possibly IUD Rhogam Given postpartum: Yes Information for the patient's newborn:  Sueann, Brownley [[id number]]  O POS   Rubella vaccine given postpartum: no Varicella vaccine given postpartum: no TDaP given antepartum or postpartum: Yes  Newborn Data: Live born female  Birth Weight: 9 lb 7.7 oz (4300 g) APGAR: 9, 9  Newborn Delivery   Birth date/time:  09/27/2017 18:41:00 Delivery type:  C-Section, Low Transverse C-section categorization:  Primary      Baby Feeding: Breast  Disposition:home with mother  SIGNED: Vena Austria, MD 09/30/2017 8:09 AM

## 2017-09-27 NOTE — Discharge Instructions (Signed)

## 2017-09-27 NOTE — Progress Notes (Signed)
L&D Progress Note   IOL/ augmentation of labor at 41wk 1 day. Presented with contractions and had dilated to 3 cm. When contractions petered out was started on Pitocin yesterday evening between 6 and 7 PM. AROM for MSAF at 0131 this morning. Epidural placed this morning at 0228 and was initially working well, but patient currently complaining of lower abdominal pain and back pain with contractions and she can feel the foley catheter in place. Anesthesia just up to bolus epidural. Dr Tiburcio PeaHArris ordered decreasing Pitocin from 18 to 8 miu/min  S: Sleeping between contractions, but wakes with contraction  O: BP 122/68   Pulse 73   Temp 98.1 F (36.7 C) (Oral)   Resp 18   Ht 5\' 3"  (1.6 m)   Wt 84.4 kg (186 lb)   LMP 12/13/2016   SpO2 99%   BMI 32.95 kg/m    FHR: 135 baseline with accelerations to 150s, moderate variability with early decelerations IUPC: contractions every 2-4 minutes, but strength of the contractions has decreased with decreasing Pitocin. mvus 85 Cervix: 6/80-90%/-1 to 0/LOT, head in military position, not well flexed  A: slow progress, currently inadequate contractions Pain not well controlled on epidural Reassuring FHR strip  \P: Increase Pitocin again-if not adequate by the time she is at 20 miu/min, will stop Pitocin for an hour, then restart. Consult anesthesia if pain not well controlled. Continue to monitor FWB closely  Sheila Connersolleen Mareli Fox, CNM

## 2017-09-27 NOTE — Anesthesia Procedure Notes (Signed)
Epidural Patient location during procedure: OR Start time: 09/27/2017 6:14 PM End time: 09/27/2017 6:34 PM  Staffing Anesthesiologist: Alver FisherPenwarden, Amy, MD Performed: anesthesiologist   Preanesthetic Checklist Completed: patient identified, site marked, surgical consent, pre-op evaluation, timeout performed, IV checked, risks and benefits discussed and monitors and equipment checked  Epidural Patient position: sitting Prep: ChloraPrep Patient monitoring: heart rate, continuous pulse ox and blood pressure Approach: midline Location: L4-L5 Injection technique: LOR saline  Needle:  Needle type: Tuohy  Needle gauge: 18 G Needle length: 9 cm and 9 Needle insertion depth: 5 cm Catheter type: closed end flexible Catheter size: 20 Guage Catheter at skin depth: 9 cm Test dose: negative  Assessment Events: blood not aspirated, injection not painful, no injection resistance, negative IV test and no paresthesia  Additional Notes CSE performed with needle through needle technique, 25g pencan spinal needle  Patient tolerated the insertion well without complications.Reason for block:procedure for pain

## 2017-09-27 NOTE — Progress Notes (Signed)
L&D Progress Note  S: Continues to complain of lower abdominal and back pain with contractions despite bolus x 2 of epidural. Given Stadol 1 mgm for pain with some relief  O: BP 137/74   Pulse 79   Temp 98.2 F (36.8 C) (Oral)   Resp 16   Ht 5\' 3"  (1.6 m)   Wt 84.4 kg (186 lb)   LMP 12/13/2016   SpO2 99%   BMI 32.95 kg/m    FHR: 140 baseline with accelerations to 160, moderate variability COntractions: Pitocin increased up to 12 miu, then had no rest in between contractions for >10 minutes. Contractions palpate strong, but the IUPC does not reflect that. Pitocin decreased to 8 miu and then the contractions spaced out and she began having couplets and triplets.  Cervix: 6/80%/-1 to 0/ LOP   A: Dysfunctional labor   P: Will discontinue Pitocin for 1 hour and let Pitocin receptors wash out Then restart Pitocin with a new bag of Pitocin and replace IUPC (have already replaced transducer cord.   Farrel Connersolleen Jolynne Spurgin, CNM

## 2017-09-27 NOTE — Op Note (Signed)
Cesarean Section Procedure Note Indications: cephalo-pelvic disproportion, failure to progress: arrest of dilation and term intrauterine pregnancy  Pre-operative Diagnosis: Intrauterine pregnancy 8138w1d ;  cephalo-pelvic disproportion, failure to progress: arrest of dilation and term intrauterine pregnancy Post-operative Diagnosis: same, delivered. Procedure: Low Transverse Cesarean Section Surgeon: Annamarie MajorPaul Bellanie Matthew, MD, FACOG Assistant(s): Farrel Connersolleen Gutierrez, CNM; No other capable assistant available, in surgery requiring high level assistant. Anesthesia: Spinal anesthesia Estimated Blood Loss:1000 Complications: None; patient tolerated the procedure well. Disposition: PACU - hemodynamically stable. Condition: stable  Findings: A female infant in the cephalic presentation. Amniotic fluid - Meconium  Birth weight 9-8lbs.  Apgars of 9 and 9.  Intact placenta with a three-vessel cord. Grossly normal uterus, tubes and ovaries bilaterally. No intraabdominal adhesions were noted.  Procedure Details   The patient was taken to Operating Room, identified as the correct patient and the procedure verified as C-Section Delivery. A Time Out was held and the above information confirmed. After induction of anesthesia, the patient was draped and prepped in the usual sterile manner. A Pfannenstiel incision was made and carried down through the subcutaneous tissue to the fascia. Fascial incision was made and extended transversely with the Mayo scissors. The fascia was separated from the underlying rectus tissue superiorly and inferiorly. The peritoneum was identified and entered bluntly. Peritoneal incision was extended longitudinally. The utero-vesical peritoneal reflection was incised transversely and a bladder flap was created digitally.  A low transverse hysterotomy was made. The fetus was delivered atraumatically. The umbilical cord was clamped x2 and cut and the infant was handed to the awaiting pediatricians.  The placenta was removed intact and appeared normal with a 3-vessel cord.  The uterus was exteriorized and cleared of all clot and debris. The hysterotomy was closed with running sutures of 0 Vicryl suture. A second imbricating layer was placed with the same suture. Excellent hemostasis was observed. The uterus was returned to the abdomen. The pelvis was irrigated and again, excellent hemostasis was noted.  The On Q Pain pump System was then placed.  Trocars were placed through the abdominal wall into the subfascial space and these were used to thread the silver soaker cathaters into place.The rectus fascia was then reapproximated with running sutures of Maxon, with careful placement not to incorporate the cathaters. Subcutaneous tissues are then irrigated with saline and hemostasis assured.  Skin is then closed with 4-0 vicryl suture in a subcuticular fashion followed by skin adhesive. The cathaters are flushed each with 5 mL of Bupivicaine and stabilized into place with dressing. Instrument, sponge, and needle counts were correct prior to the abdominal closure and at the conclusion of the case.  The patient tolerated the procedure well and was transferred to the recovery room in stable condition.   Annamarie MajorPaul Sidonia Nutter, MD, Merlinda FrederickFACOG [facility], [facility] 09/27/2017  7:25 PM

## 2017-09-27 NOTE — Progress Notes (Addendum)
L&D Progress Note  S: tearful, frustrated by lack of progress  O: BP 120/66   Pulse 75   Temp 98.4 F (36.9 C) (Oral)   Resp 18   Ht 5\' 3"  (1.6 m)   Wt 84.4 kg (186 lb)   LMP 12/13/2016   SpO2 99%   BMI 32.95 kg/m    Pitocin was restarted at 1320. Contractions stopped completely while the Pitocin was off. Currently at 12 miu/min and contractions are every 2-3 minutes, but mvus <100. FHR: 145 with accelerations to 160s to 170s, moderate variability, some very mild early/variable decelerations  Cervix: 6/80-90%/ -1 to 0 Has been ruptured for 16 hours  A: no progress FHR tracing reassuring  P: Dr Tiburcio PeaHarris will be coming to discuss options with patient If labor continues, will plan on starting antibiotics at 1930 for prolonged rupture of membranes.  Farrel Connersolleen Kinaya Hilliker, CNM

## 2017-09-27 NOTE — Progress Notes (Signed)
  Labor Progress Note   27 y.o. G1P0 @ 9626w1d , admitted for  Pregnancy, Labor Management.   Subjective:  Pain despite epidural No cervical change in several hours despite dosing changes of Pitocin, IUPC to document ctx strength, epidural for comfort, prior AROM (now >15 hours)  Objective:  BP (!) 125/53   Pulse 64   Temp 98.4 F (36.9 C) (Oral)   Resp 18   Ht 5\' 3"  (1.6 m)   Wt 186 lb (84.4 kg)   LMP 12/13/2016   SpO2 99%   BMI 32.95 kg/m  Abd: moderate Extr: trace to 1+ bilateral pedal edema SVE: unchanged per CNM exam  EFM: FHR: 140 bpm, variability: moderate,  accelerations:  Present,  decelerations:  Absent Toco: Frequency: Every 2-3 minutes Labs: I have reviewed the patient's lab results.   Assessment & Plan:  G1P0 @ 6826w1d, admitted for  Pregnancy and Labor/Delivery Management  1. Pain management: epidural. 2. FWB: FHT category 1.  3. ID: GBS negative 4. Labor management: Protracted labor with arrest of dilation at 6 cm Continued active management of labor vs cesarean section d/w pt.  Agrees to plan for CS at this time due to CPD/protracted labor/arrest of dilation. ABX- Cefoxitin and Azithromycin (due to prolonged ROM now 16 hours). OnQ Pain Pump discussed Recovery discussed.  The risks of cesarean section discussed with the patient included but were not limited to: bleeding which may require transfusion or reoperation; infection which may require antibiotics; injury to bowel, bladder, ureters or other surrounding organs; injury to the fetus; need for additional procedures including hysterectomy in the event of a life-threatening hemorrhage; placental abnormalities wth subsequent pregnancies, incisional problems, thromboembolic phenomenon and other postoperative/anesthesia complications. The patient concurred with the proposed plan, giving informed written consent for the procedure.   All discussed with patient, see orders  Annamarie MajorPaul Amilio Zehnder, MD, Merlinda FrederickFACOG [facility],  [facility] 09/27/2017  5:26 PM

## 2017-09-27 NOTE — Anesthesia Procedure Notes (Addendum)
Epidural Patient location during procedure: OB Start time: 09/27/2017 12:24 AM End time: 09/27/2017 12:53 AM  Staffing Anesthesiologist: Yves Dillarroll, Iretta Mangrum, MD Performed: anesthesiologist   Preanesthetic Checklist Completed: patient identified, site marked, surgical consent, pre-op evaluation, timeout performed, IV checked, risks and benefits discussed and monitors and equipment checked  Epidural Patient position: sitting Prep: Betadine Patient monitoring: heart rate, continuous pulse ox and blood pressure Approach: midline Location: L3-L4 Injection technique: LOR air  Needle:  Needle type: Tuohy  Needle gauge: 17 G Needle length: 9 cm and 9 Catheter type: closed end flexible Catheter size: 19 Gauge Test dose: negative and 1.5% lidocaine with Epi 1:200 K  Assessment Events: blood not aspirated, injection not painful, no injection resistance, negative IV test and no paresthesia  Additional Notes Time out called.  Patient placed in sitting position.  Back prepped and draped in sterile fashion.  A skin wheal was made in the L3-L4 interspace with 1% Lidocaine plain.  A 17G Tuohy needle was advanced into the epidural space by a loss of resistance technique.  The epidural catheter was threaded 5 cm into the space and the initial TD was equivacol with no increase in HR but some ringing in the ears.  The catheter was pulled back 2 cm and the repeat TD was negative with no problem noted by the patient and no increase in HR.  No fluid aspirated at any time.  The catheter was affixed to the back in sterile fashion.  The patient tolerated the procedure well.Reason for block:procedure for pain

## 2017-09-28 LAB — CBC
HCT: 27.2 % — ABNORMAL LOW (ref 35.0–47.0)
Hemoglobin: 9.7 g/dL — ABNORMAL LOW (ref 12.0–16.0)
MCH: 32.2 pg (ref 26.0–34.0)
MCHC: 35.6 g/dL (ref 32.0–36.0)
MCV: 90.6 fL (ref 80.0–100.0)
Platelets: 152 10*3/uL (ref 150–440)
RBC: 3 MIL/uL — ABNORMAL LOW (ref 3.80–5.20)
RDW: 14.3 % (ref 11.5–14.5)
WBC: 9.7 10*3/uL (ref 3.6–11.0)

## 2017-09-28 LAB — RPR
RPR: NONREACTIVE
RPR: NONREACTIVE

## 2017-09-28 MED ORDER — IBUPROFEN 600 MG PO TABS
600.0000 mg | ORAL_TABLET | Freq: Four times a day (QID) | ORAL | Status: DC
  Administered 2017-09-28 – 2017-09-30 (×9): 600 mg via ORAL
  Filled 2017-09-28 (×9): qty 1

## 2017-09-28 NOTE — Lactation Note (Addendum)
This note was copied from a baby's chart. Lactation Consultation Note  Patient Name: Boy Lesli AlbeeBrignon Claar WUJWJ'XToday's Date: 09/28/2017     Maternal Data    Feeding Feeding Type: Bottle Fed - Formula Nipple Type: Slow - flow  LATCH Score                   Interventions Interventions: DEBP(set up breast pump with lactation )  Lactation Tools Discussed/Used     Consult Status  Mother states that infant came back from the nursery after a bath and is currently swaddled inside the crib asleep. Mother states that she had just finished bottle feeding infant. Nurse discussed pumping with mother and assisted with set-up and demonstration of how to use DEBP. Mother has pumped once and did not pump out any colostrum. LC encouraged mother to continue pumping every 2-3 hours to encourage colostrum production. Mother has flat nipples and was given the soft shells to wear in her bra in between pumping to help evert nipples. After wearing the shells infant is more alert and attempted to latch. Infant was able to latch but could not maintain the latch. Nipple shield was used which helped infant to latch successfully. Mother states that she followed up afterwards with giving infant a bottle.    Arlyss Gandylicia Sharese Manrique 09/28/2017, 2:57 PM

## 2017-09-28 NOTE — Anesthesia Postprocedure Evaluation (Signed)
Anesthesia Post Note  Patient: Tationa S Bartelt  Procedure(s) Performed: CESAREAN SECTION (N/A Abdomen)  Patient location during evaluation: Mother Baby Anesthesia Type: Combined Spinal/Epidural Level of consciousness: oriented and awake and alert Pain management: pain level controlled Vital Signs Assessment: post-procedure vital signs reviewed and stable Respiratory status: spontaneous breathing, respiratory function stable and patient connected to nasal cannula oxygen Cardiovascular status: blood pressure returned to baseline and stable Postop Assessment: no headache, no backache and no apparent nausea or vomiting Anesthetic complications: no     Last Vitals:  Vitals:   09/28/17 0340 09/28/17 0730  BP: 126/64 116/68  Pulse: 78 82  Resp: 18 20  Temp: 36.9 C 37 C  SpO2: 100% 99%    Last Pain:  Vitals:   09/28/17 0730  TempSrc: Oral  PainSc:                  Rica MastBachich,  Andrina Locken M

## 2017-09-28 NOTE — Lactation Note (Signed)
This note was copied from a baby's chart. Lactation Consultation Note  Patient Name: Sheila Fox ZOXWR'UToday's Date: 09/28/2017 Reason for consult: Initial assessment   Maternal Data    Feeding Feeding Type: Breast Fed(Attempted) Nipple Type: Slow - flow  LATCH Score Latch: Too sleepy or reluctant, no latch achieved, no sucking elicited.  Audible Swallowing: None  Type of Nipple: Flat  Comfort (Breast/Nipple): Soft / non-tender  Hold (Positioning): Assistance needed to correctly position infant at breast and maintain latch.  LATCH Score: 4  Interventions Interventions: Skin to skin;Assisted with latch;Breast feeding basics reviewed;Support pillows  Lactation Tools Discussed/Used     Consult Status  LC to room to assist with breastfeeding. Patient states that infant is very sleepy and will not suck when placed at the breast. LC assisted with positioning infant first in the cradle position and then in the football hold using the nipple shield. Infant continues to be sleepy and would barely suck. Infant was placed skin to skin with mother.    Sheila Fox 09/28/2017, 10:38 AM

## 2017-09-28 NOTE — Progress Notes (Signed)
Admit Date: 09/26/2017 Today's Date: 09/28/2017  Subjective: Postpartum Day 1: Cesarean Delivery Patient reports incisional pain, tolerating PO, + flatus and no problems voiding.    Objective: Vital signs in last 24 hours: Temp:  [98.4 F (36.9 C)-99.3 F (37.4 C)] 98.9 F (37.2 C) (08/01 1132) Pulse Rate:  [64-121] 82 (08/01 0730) Resp:  [12-30] 20 (08/01 0730) BP: (108-137)/(53-100) 116/68 (08/01 0730) SpO2:  [91 %-100 %] 99 % (08/01 0730)  Physical Exam:  General: alert, cooperative and no distress Lochia: appropriate Uterine Fundus: firm Incision: healing well, no significant drainage, no dehiscence, no significant erythema DVT Evaluation: No evidence of DVT seen on physical exam. Negative Homan's sign.  Recent Labs    09/26/17 2132 09/28/17 0524  HGB 12.4 9.7*  HCT 35.4 27.2*    Assessment/Plan: Status post Cesarean section. Doing well postoperatively.  Continue current care O - , assess for Rhogam need Fe foe anemia, along w PNV Ambulate, reg diet Breast feeding.  Sheila Fox 09/28/2017, 12:12 PM

## 2017-09-28 NOTE — Anesthesia Post-op Follow-up Note (Signed)
  Anesthesia Pain Follow-up Note  Patient: Sheila Fox  Day #: 1  Date of Follow-up: 09/28/2017 Time: 8:24 AM  Last Vitals:  Vitals:   09/28/17 0340 09/28/17 0730  BP: 126/64 116/68  Pulse: 78 82  Resp: 18 20  Temp: 36.9 C 37 C  SpO2: 100% 99%    Level of Consciousness: alert  Pain: mild   Side Effects:None  Catheter Site Exam:dry     Plan: D/C from anesthesia care at surgeon's request  Rica MastBachich,  Jamaiyah Pyle M

## 2017-09-29 LAB — FETAL SCREEN: Fetal Screen: NEGATIVE

## 2017-09-29 MED ORDER — RHO D IMMUNE GLOBULIN 1500 UNIT/2ML IJ SOSY
300.0000 ug | PREFILLED_SYRINGE | Freq: Once | INTRAMUSCULAR | Status: AC
  Administered 2017-09-29: 300 ug via INTRAMUSCULAR
  Filled 2017-09-29: qty 2

## 2017-09-29 NOTE — Progress Notes (Signed)
  Subjective:   Post Op Day 2: Patient has had some stabbing incisional pain. She is due for her next dose of Oxycodone. She reports that otherwise she is doing well. She is attempting to breastfeed and has assistance from lactation. She is tolerating PO intake and her pain is controlled with PO medications and On Q pump. She is ambulating and voiding without difficulty. She had 3 bowel movements yesterday. Discussed staying until tomorrow until pain is well controlled or possible discharge later today depending on her pain response today.   Objective:  Blood pressure 121/78, pulse 94, temperature 98.2 F (36.8 C), temperature source Oral, resp. rate 18, height 5\' 3"  (1.6 m), weight 186 lb (84.4 kg), last menstrual period 12/13/2016, SpO2 100 %, currently breastfeeding and formula feeding.  General: NAD Pulmonary: no increased work of breathing Abdomen: non-distended, non-tender, fundus firm at level of umbilicus Incision: honeycomb dressing is C/D/I, On Q pump dressing is C/D/I Extremities: no edema, no erythema, no tenderness  Results for orders placed or performed during the hospital encounter of 09/26/17 (from the past 24 hour(s))  Fetal screen     Status: None   Collection Time: 09/29/17  4:49 AM  Result Value Ref Range   Fetal Screen      NEG Performed at [facility], [address] [place], [place] [id number]   Rhogam injection     Status: None (Preliminary result)   Collection Time: 09/29/17  4:49 AM  Result Value Ref Range   Unit Number [id number]    Blood Component Type RHIG    Unit division 00    Status of Unit ALLOCATED    Transfusion Status OK TO TRANSFUSE       Assessment:   27 y.o. G1P1001 postoperativeday # 2   Plan:  1) Acute blood loss anemia - hemodynamically stable and asymptomatic - po ferrous sulfate  2) O negative with baby blood type O positive, Rhogam indicated  3) Rubella Immune, Varicella Immune   4) TDAP status: given  antepartum  5) Breast/Formula/Contraception: undecided, reviewed progesterone-only and non-hormonal choices  6) Disposition: Possible discharge to home later today with more likely discharge to home tomorrow   Tresea MallJane Rome Echavarria, CNM

## 2017-09-30 LAB — RHOGAM INJECTION: Unit division: 0

## 2017-09-30 MED ORDER — IBUPROFEN 600 MG PO TABS: 600.0000 mg | ORAL_TABLET | Freq: Four times a day (QID) | ORAL | 0 refills | Status: DC

## 2017-09-30 MED ORDER — OXYCODONE-ACETAMINOPHEN 5-325 MG PO TABS: 1.0000 | ORAL_TABLET | ORAL | 0 refills | Status: DC | PRN

## 2017-09-30 NOTE — Progress Notes (Signed)
Reviewed D/C instructions with pt and family. Pt verbalized understanding of teaching. Discharged to self care in room, to stay with baby as PEDS patient.. Pt to schedule f/u appt in 2 wks.

## 2017-09-30 NOTE — Plan of Care (Signed)
Vital signs stable; fundus firm; small amount rubra lochia; pain controlled with po motrin and po percocet; voiding; good appetite; good po fluids; breastfeeding, pumping, and bottle feeding infant with good technique observed; good maternal-infant bonding observed; plans for discharge today.

## 2017-10-04 ENCOUNTER — Ambulatory Visit (INDEPENDENT_AMBULATORY_CARE_PROVIDER_SITE_OTHER): Payer: BLUE CROSS/BLUE SHIELD | Admitting: Advanced Practice Midwife

## 2017-10-04 ENCOUNTER — Encounter: Payer: Self-pay | Admitting: Advanced Practice Midwife

## 2017-10-04 NOTE — Progress Notes (Signed)
      Postoperative Follow-up Patient presents post op from primary cesarean section 7days ago   Subjective: Patient reports doing well.  Eating a regular diet without difficulty. Pain is controlled with current analgesics. Medications being used: acetaminophen, ibuprofen (OTC) and narcotic analgesics including Percocet.  Activity: postpartum with usual c/section restrictions.  Objective: Blood pressure 120/80, height 5\' 3"  (1.6 m), weight 168 lb (76.2 kg), last menstrual period 12/13/2016, currently breastfeeding.  Constitutional: Well nourished, well developed female in no acute distress.  HEENT: normal Skin: Warm and dry.  Cardiovascular: Regular rate and rhythm.   Extremity: no evidence of DVT  Respiratory: Clear to auscultation bilateral. Normal respiratory effort Abdomen: Incision well approximated and healing well. No drainage. Glue is pulling on skin- able to remove a small amount of glue and release skin that was stuck together Psych: Alert and Oriented x3. No memory deficits. Normal mood and affect.  MS: normal gait, normal bilateral lower extremity ROM/strength/stability.    Routine Prenatal on 08/22/2017  Component Date Value Ref Range Status  . Strep Gp B NAA 08/22/2017 Negative  Negative Final   Comment: Centers for Disease Control and Prevention (CDC) and American Congress of Obstetricians and Gynecologists (ACOG) guidelines for prevention of perinatal group B streptococcal (GBS) disease specify co-collection of a vaginal and rectal swab specimen to maximize sensitivity of GBS detection. Per the CDC and ACOG, swabbing both the lower vagina and rectum substantially increases the yield of detection compared with sampling the vagina alone. Penicillin G, ampicillin, or cefazolin are indicated for intrapartum prophylaxis of perinatal GBS colonization. Reflex susceptibility testing should be performed prior to use of clindamycin only on GBS isolates from  penicillin-allergic women who are considered a high risk for anaphylaxis. Treatment with vancomycin without additional testing is warranted if resistance to clindamycin is noted.     Assessment: 27 y.o. s/p Primary c/section, 1 week incision check progressing well  Plan: Patient has done well after surgery with no apparent complications.  I have discussed the post-operative course to date, and the expected progress moving forward.  The patient understands what complications to be concerned about.  I Fox see the patient in routine follow up, or sooner if needed.    Activity plan: Continue Post Op/postpartum care Return for 6 week postpartum visit   Sheila Fox, CNM [facility], [facility] 10/04/2017, 1:02 PM

## 2017-11-08 ENCOUNTER — Ambulatory Visit (INDEPENDENT_AMBULATORY_CARE_PROVIDER_SITE_OTHER): Payer: BLUE CROSS/BLUE SHIELD | Admitting: Obstetrics & Gynecology

## 2017-11-08 ENCOUNTER — Other Ambulatory Visit (HOSPITAL_COMMUNITY)
Admission: RE | Admit: 2017-11-08 | Discharge: 2017-11-08 | Disposition: A | Discharge status: Home / Self Care | Payer: BLUE CROSS/BLUE SHIELD | Source: Ambulatory Visit | Attending: Obstetrics & Gynecology | Admitting: Obstetrics & Gynecology

## 2017-11-08 ENCOUNTER — Encounter: Payer: Self-pay | Admitting: Obstetrics & Gynecology

## 2017-11-08 DIAGNOSIS — Z124 Encounter for screening for malignant neoplasm of cervix: Secondary | ICD-10-CM

## 2017-11-08 MED ORDER — ETONOGESTREL-ETHINYL ESTRADIOL 0.12-0.015 MG/24HR VA RING: VAGINAL_RING | VAGINAL | 11 refills | Status: DC

## 2017-11-08 NOTE — Patient Instructions (Signed)
Ethinyl Estradiol; Etonogestrel vaginal ring What is this medicine? ETHINYL ESTRADIOL; ETONOGESTREL (ETH in il es tra DYE ole; et oh noe JES trel) vaginal ring is a flexible, vaginal ring used as a contraceptive (birth control method). This medicine combines two types of female hormones, an estrogen and a progestin. This ring is used to prevent ovulation and pregnancy. Each ring is effective for one month. This medicine may be used for other purposes; ask your health care provider or pharmacist if you have questions. COMMON BRAND NAME(S): NuvaRing What should I tell my health care provider before I take this medicine? They need to know if you have or ever had any of these conditions: -abnormal vaginal bleeding -blood vessel disease or blood clots -breast, cervical, endometrial, ovarian, liver, or uterine cancer -diabetes -gallbladder disease -heart disease or recent heart attack -high blood pressure -high cholesterol -kidney disease -liver disease -migraine headaches -stroke -systemic lupus erythematosus (SLE) -tobacco smoker -an unusual or allergic reaction to estrogens, progestins, other medicines, foods, dyes, or preservatives -pregnant or trying to get pregnant -breast-feeding How should I use this medicine? Insert the ring into your vagina as directed. Follow the directions on the prescription label. The ring will remain place for 3 weeks and is then removed for a 1-week break. A new ring is inserted 1 week after the last ring was removed, on the same day of the week. Check often to make sure the ring is still in place, especially before and after sexual intercourse. If the ring was out of the vagina for an unknown amount of time, you may not be protected from pregnancy. Perform a pregnancy test and call your doctor. Do not use more often than directed. A patient package insert for the product will be given with each prescription and refill. Read this sheet carefully each time. The  sheet may change frequently. Contact your pediatrician regarding the use of this medicine in children. Special care may be needed. This medicine has been used in female children who have started having menstrual periods. Overdosage: If you think you have taken too much of this medicine contact a poison control center or emergency room at once. NOTE: This medicine is only for you. Do not share this medicine with others. What if I miss a dose? You will need to replace your vaginal ring once a month as directed. If the ring should slip out, or if you leave it in longer or shorter than you should, contact your health care professional for advice. What may interact with this medicine? Do not take this medicine with the following medication: -dasabuvir; ombitasvir; paritaprevir; ritonavir -ombitasvir; paritaprevir; ritonavir This medicine may also interact with the following medications: -acetaminophen -antibiotics or medicines for infections, especially rifampin, rifabutin, rifapentine, and griseofulvin, and possibly penicillins or tetracyclines -aprepitant -ascorbic acid (vitamin C) -atorvastatin -barbiturate medicines, such as phenobarbital -bosentan -carbamazepine -caffeine -clofibrate -cyclosporine -dantrolene -doxercalciferol -felbamate -grapefruit juice -hydrocortisone -medicines for anxiety or sleeping problems, such as diazepam or temazepam -medicines for diabetes, including pioglitazone -modafinil -mycophenolate -nefazodone -oxcarbazepine -phenytoin -prednisolone -ritonavir or other medicines for HIV infection or AIDS -rosuvastatin -selegiline -soy isoflavones supplements -St. John's wort -tamoxifen or raloxifene -theophylline -thyroid hormones -topiramate -warfarin This list may not describe all possible interactions. Give your health care provider a list of all the medicines, herbs, non-prescription drugs, or dietary supplements you use. Also tell them if you smoke,  drink alcohol, or use illegal drugs. Some items may interact with your medicine. What should I watch for while using   this medicine? Visit your doctor or health care professional for regular checks on your progress. You will need a regular breast and pelvic exam and Pap smear while on this medicine. Use an additional method of contraception during the first cycle that you use this ring. Do not use a diaphragm or female condom, as the ring can interfere with these birth control methods and their proper placement. If you have any reason to think you are pregnant, stop using this medicine right away and contact your doctor or health care professional. If you are using this medicine for hormone related problems, it may take several cycles of use to see improvement in your condition. Smoking increases the risk of getting a blood clot or having a stroke while you are using hormonal birth control, especially if you are more than 29 years old. You are strongly advised not to smoke. This medicine can make your body retain fluid, making your fingers, hands, or ankles swell. Your blood pressure can go up. Contact your doctor or health care professional if you feel you are retaining fluid. This medicine can make you more sensitive to the sun. Keep out of the sun. If you cannot avoid being in the sun, wear protective clothing and use sunscreen. Do not use sun lamps or tanning beds/booths. If you wear contact lenses and notice visual changes, or if the lenses begin to feel uncomfortable, consult your eye care specialist. In some women, tenderness, swelling, or minor bleeding of the gums may occur. Notify your dentist if this happens. Brushing and flossing your teeth regularly may help limit this. See your dentist regularly and inform your dentist of the medicines you are taking. If you are going to have elective surgery, you may need to stop using this medicine before the surgery. Consult your health care professional  for advice. This medicine does not protect you against HIV infection (AIDS) or any other sexually transmitted diseases. What side effects may I notice from receiving this medicine? Side effects that you should report to your doctor or health care professional as soon as possible: -breast tissue changes or discharge -changes in vaginal bleeding during your period or between your periods -chest pain -coughing up blood -dizziness or fainting spells -headaches or migraines -leg, arm or groin pain -severe or sudden headaches -stomach pain (severe) -sudden shortness of breath -sudden loss of coordination, especially on one side of the body -speech problems -symptoms of vaginal infection like itching, irritation or unusual discharge -tenderness in the upper abdomen -vomiting -weakness or numbness in the arms or legs, especially on one side of the body -yellowing of the eyes or skin Side effects that usually do not require medical attention (report to your doctor or health care professional if they continue or are bothersome): -breakthrough bleeding and spotting that continues beyond the 3 initial cycles of pills -breast tenderness -mood changes, anxiety, depression, frustration, anger, or emotional outbursts -increased sensitivity to sun or ultraviolet light -nausea -skin rash, acne, or brown spots on the skin -weight gain (slight) This list may not describe all possible side effects. Call your doctor for medical advice about side effects. You may report side effects to FDA at 1-800-FDA-1088. Where should I keep my medicine? Keep out of the reach of children. Store at room temperature between 15 and 30 degrees C (59 and 86 degrees F) for up to 4 months. The product will expire after 4 months. Protect from light. Throw away any unused medicine after the expiration date. NOTE: This   sheet is a summary. It may not cover all possible information. If you have questions about this medicine, talk  to your doctor, pharmacist, or health care provider.  2018 Elsevier/Gold Standard (2015-10-23 17:00:31)  

## 2017-11-08 NOTE — Progress Notes (Signed)
  OBSTETRICS POSTPARTUM CLINIC PROGRESS NOTE  Subjective:     Sheila Fox is a 27 y.o. G39P1001 female who presents for a postpartum visit. She is 6 weeks postpartum following a Term pregnancy and delivery by C-section failure to progress.  I have fully reviewed the prenatal and intrapartum course. Anesthesia: epidural.  Postpartum course has been complicated by uncomplicated.  Baby is feeding by Bottle and Breast.  Bleeding: patient has resumed menses.  Bowel function is normal. Bladder function is normal.  Patient is not sexually active. Contraception method desired is NuvaRing vaginal inserts.  Postpartum depression screening: negative.  The following portions of the patient's history were reviewed and updated as appropriate: allergies, current medications, past family history, past medical history, past social history, past surgical history and problem list.  Review of Systems Pertinent items are noted in HPI.  Objective:    BP 120/80   Ht 5\' 4"  (1.626 m)   Wt 151 lb (68.5 kg)   LMP 12/13/2016   BMI 25.92 kg/m   General:  alert and no distress   Breasts:  inspection negative, no nipple discharge or bleeding, no masses or nodularity palpable  Lungs: clear to auscultation bilaterally  Heart:  regular rate and rhythm, S1, S2 normal, no murmur, click, rub or gallop  Abdomen: soft, non-tender; bowel sounds normal; no masses,  no organomegaly.  Well healed Pfannenstiel incision   Vulva:  normal  Vagina: normal vagina, no discharge, exudate, lesion, or erythema  Cervix:  no cervical motion tenderness and no lesions  Corpus: normal size, contour, position, consistency, mobility, non-tender  Adnexa:  normal adnexa and no mass, fullness, tenderness  Rectal Exam: Not performed.        Assessment:  Post Partum Care visit 1. Cesarean delivery delivered 2. Screening for cervical cancer.  Plan:  See orders and Patient Instructions Contraceptive counseling for NuvaRing vaginal  inserts Resume all normal activities Follow up in: 12 months or as needed.  PAP today  Annamarie Major, MD, [facility], [facility] 11/08/2017  11:12 AM

## 2017-11-09 LAB — CYTOLOGY - PAP: DIAGNOSIS: NEGATIVE

## 2018-10-14 ENCOUNTER — Other Ambulatory Visit: Payer: Self-pay

## 2018-10-14 ENCOUNTER — Encounter: Payer: Self-pay | Admitting: *Deleted

## 2018-10-14 DIAGNOSIS — R197 Diarrhea, unspecified: Secondary | ICD-10-CM | POA: Insufficient documentation

## 2018-10-14 DIAGNOSIS — Z20828 Contact with and (suspected) exposure to other viral communicable diseases: Secondary | ICD-10-CM | POA: Diagnosis not present

## 2018-10-14 LAB — POCT PREGNANCY, URINE: Preg Test, Ur: NEGATIVE

## 2018-10-14 NOTE — ED Triage Notes (Signed)
Patient c/o nausea earlier in the day, c/o diarrhea. Patient had a T max at home of 100. Patient c/o body aches.

## 2018-10-15 ENCOUNTER — Emergency Department
Admission: EM | Admit: 2018-10-15 | Discharge: 2018-10-15 | Disposition: A | Discharge status: Home / Self Care | Payer: BC Managed Care – PPO | Attending: Emergency Medicine | Admitting: Emergency Medicine

## 2018-10-15 DIAGNOSIS — R197 Diarrhea, unspecified: Secondary | ICD-10-CM

## 2018-10-15 LAB — SARS CORONAVIRUS 2 (TAT 6-24 HRS): SARS Coronavirus 2: NEGATIVE

## 2018-10-15 MED ORDER — ONDANSETRON HCL 4 MG PO TABS: 4.0000 mg | ORAL_TABLET | Freq: Three times a day (TID) | ORAL | 0 refills | Status: DC | PRN

## 2018-10-15 NOTE — Discharge Instructions (Addendum)
Please seek medical attention for any high fevers, chest pain, shortness of breath, change in behavior, persistent vomiting, bloody stool or any other new or concerning symptoms.  

## 2018-10-15 NOTE — ED Provider Notes (Signed)
[facility] Emergency Department Provider Note  ____________________________________________   I have reviewed the triage vital signs and the nursing notes.   HISTORY  Chief Complaint Diarrhea  History limited by: Not Limited   HPI Sheila Fox is a 28 y.o. female who presents to the emergency department today with primary concerns for diarrhea.  Patient states she had multiple episodes of diarrhea today.  Patient denies any blood in her diarrhea.  She did have some associated abdominal discomfort and nausea with this.  She denies any fevers.  Denies any unusual ingestions.  She states that she has been running low-grade fevers recently.   Records reviewed.   Past Medical History:  Diagnosis Date  . Hx of abnormal cervical Pap smear     Patient Active Problem List   Diagnosis Date Noted  . Cesarean delivery delivered 11/08/2017    Past Surgical History:  Procedure Laterality Date  . CESAREAN SECTION N/A 09/27/2017   Procedure: CESAREAN SECTION;  Surgeon: Nadara MustardHarris, Robert P, MD;  Location: [facility] ORS;  Service: Obstetrics;  Laterality: N/A;  Female born @ [id number] Apgars:9/9 Weight: 9lbs 8 ozs  . FOOT SURGERY Left 2007  . WISDOM TOOTH EXTRACTION  2009   all four    Prior to Admission medications   Medication Sig Start Date End Date Taking? Authorizing Provider  cetirizine (ZYRTEC) 10 MG tablet Take 10 mg by mouth daily.    [provider]  etonogestrel-ethinyl estradiol (NUVARING) 0.12-0.015 MG/24HR vaginal ring Insert vaginally and leave in place for 3 consecutive weeks, then remove for 1 week. 11/08/17   Nadara MustardHarris, Robert P, MD  ibuprofen (ADVIL,MOTRIN) 600 MG tablet Take 1 tablet (600 mg total) by mouth every 6 (six) hours. Patient not taking: Reported on 10/04/2017 09/30/17   Vena AustriaStaebler, Andreas, MD  Prenatal Vit-Fe Fumarate-FA (MULTIVITAMIN-PRENATAL) 27-0.8 MG TABS tablet Take 1 tablet by mouth daily at 12 noon.    [provider]     Allergies Patient has no known allergies.  Family History  Problem Relation Age of Onset  . Hyperlipidemia Sister     Social History Social History   Tobacco Use  . Smoking status: Never Smoker  . Smokeless tobacco: Never Used  Substance Use Topics  . Alcohol use: Not Currently    Comment: social  . Drug use: No    Review of Systems Constitutional: Positive for fever. Eyes: No visual changes. ENT: No sore throat. Cardiovascular: Denies chest pain. Respiratory: Denies shortness of breath. Gastrointestinal: Positive for diarrhea and nausea. Genitourinary: Negative for dysuria. Musculoskeletal: Negative for back pain. Skin: Negative for rash. Neurological: Negative for headaches, focal weakness or numbness.  ____________________________________________   PHYSICAL EXAM:  VITAL SIGNS: ED Triage Vitals  Enc Vitals Group     BP 10/14/18 2258 (!) 113/54     Pulse Rate 10/14/18 2258 70     Resp 10/14/18 2258 16     Temp 10/14/18 2258 98.4 F (36.9 C)     Temp Source 10/14/18 2258 Oral     SpO2 10/14/18 2258 100 %     Weight --      Height --      Head Circumference --      Peak Flow --      Pain Score 10/14/18 2300 7   Constitutional: Alert and oriented.  Eyes: Conjunctivae are normal.  ENT      Head: Normocephalic and atraumatic.      Nose: No congestion/rhinnorhea.      Mouth/Throat:  Mucous membranes are moist.      Neck: No stridor. Hematological/Lymphatic/Immunilogical: No cervical lymphadenopathy. Cardiovascular: Normal rate, regular rhythm.  No murmurs, rubs, or gallops.  Respiratory: Normal respiratory effort without tachypnea nor retractions. Breath sounds are clear and equal bilaterally. No wheezes/rales/rhonchi. Gastrointestinal: Soft and non tender. No rebound. No guarding.  Genitourinary: Deferred Musculoskeletal: Normal range of motion in all extremities. No lower extremity edema. Neurologic:  Normal speech and language. No gross focal  neurologic deficits are appreciated.  Skin:  Skin is warm, dry and intact. No rash noted. Psychiatric: Mood and affect are normal. Speech and behavior are normal. Patient exhibits appropriate insight and judgment.  ____________________________________________    LABS (pertinent positives/negatives)  Upreg negative ____________________________________________   EKG  None  ____________________________________________    RADIOLOGY  None  ____________________________________________   PROCEDURES  Procedures  ____________________________________________   INITIAL IMPRESSION / ASSESSMENT AND PLAN / ED COURSE  Pertinent labs & imaging results that were available during my care of the patient were reviewed by me and considered in my medical decision making (see chart for details).   Patient presented to the emergency department today with primary concerns for diarrhea.  Patient also complaining of some nausea and low-grade fevers.  Did discuss potential for getting blood work patient however given benign abdominal exam I have a low suspicion that blood work would reveal any concerning abnormalities.  Will however check COVID.  ____________________________________________   FINAL CLINICAL IMPRESSION(S) / ED DIAGNOSES  Final diagnoses:  Diarrhea, unspecified type     Note: This dictation was prepared with Dragon dictation. Any transcriptional errors that result from this process are unintentional     Nance Pear, MD 10/15/18 [phone]

## 2018-11-08 ENCOUNTER — Other Ambulatory Visit: Payer: Self-pay | Admitting: Obstetrics & Gynecology

## 2018-11-19 ENCOUNTER — Encounter: Payer: Self-pay | Admitting: Obstetrics & Gynecology

## 2018-11-19 ENCOUNTER — Ambulatory Visit (INDEPENDENT_AMBULATORY_CARE_PROVIDER_SITE_OTHER): Payer: BC Managed Care – PPO | Admitting: Obstetrics & Gynecology

## 2018-11-19 ENCOUNTER — Other Ambulatory Visit: Payer: Self-pay

## 2018-11-19 VITALS — BP 100/60 | Ht 64.0 in | Wt 165.0 lb

## 2018-11-19 DIAGNOSIS — Z01419 Encounter for gynecological examination (general) (routine) without abnormal findings: Secondary | ICD-10-CM | POA: Diagnosis not present

## 2018-11-19 DIAGNOSIS — Z124 Encounter for screening for malignant neoplasm of cervix: Secondary | ICD-10-CM

## 2018-11-19 DIAGNOSIS — Z3045 Encounter for surveillance of transdermal patch hormonal contraceptive device: Secondary | ICD-10-CM

## 2018-11-19 MED ORDER — XULANE 150-35 MCG/24HR TD PTWK: 1.0000 | MEDICATED_PATCH | TRANSDERMAL | 12 refills | Status: DC

## 2018-11-19 NOTE — Progress Notes (Signed)
HPI:      Sheila Fox is a 28 y.o. G1P1001 who LMP was Patient's last menstrual period was 11/06/2018., she presents today for her annual examination. The patient has no complaints today. The patient is sexually active. Her last pap: was normal. The patient does perform self breast exams.  There is no notable family history of breast or ovarian cancer in her family.  The patient has regular exercise: yes.  The patient denies current symptoms of depression.    GYN History: Contraception: NuvaRing vaginal inserts     Concerned they they fall partially down and she has to adjust at times, frequently    Reg cycles    No fertility desired anytime soon  PMHx: Past Medical History:  Diagnosis Date  . Hx of abnormal cervical Pap smear    Past Surgical History:  Procedure Laterality Date  . CESAREAN SECTION N/A 09/27/2017   Procedure: CESAREAN SECTION;  Surgeon: Nadara MustardHarris,  P, MD;  Location: [facility] ORS;  Service: Obstetrics;  Laterality: N/A;  Female born @ [id number] Apgars:9/9 Weight: 9lbs 8 ozs  . FOOT SURGERY Left 2007  . WISDOM TOOTH EXTRACTION  2009   all four   Family History  Problem Relation Age of Onset  . Hyperlipidemia Sister    Social History   Tobacco Use  . Smoking status: Never Smoker  . Smokeless tobacco: Never Used  Substance Use Topics  . Alcohol use: Not Currently    Comment: social  . Drug use: No    Current Outpatient Medications:  .  cetirizine (ZYRTEC) 10 MG tablet, Take 10 mg by mouth daily., Disp: , Rfl:  .  norelgestromin-ethinyl estradiol Burr Medico(XULANE) 150-35 MCG/24HR transdermal patch, Place 1 patch onto the skin once a week. One patch weekly for 3 weeks, then one week off., Disp: 3 patch, Rfl: 12 .  Prenatal Vit-Fe Fumarate-FA (MULTIVITAMIN-PRENATAL) 27-0.8 MG TABS tablet, Take 1 tablet by mouth daily at 12 noon., Disp: , Rfl:  Allergies: Patient has no known allergies.  Review of Systems  Constitutional: Negative for chills, fever and  malaise/fatigue.  HENT: Negative for congestion, sinus pain and sore throat.   Eyes: Negative for blurred vision and pain.  Respiratory: Negative for cough and wheezing.   Cardiovascular: Negative for chest pain and leg swelling.  Gastrointestinal: Negative for abdominal pain, constipation, diarrhea, heartburn, nausea and vomiting.  Genitourinary: Negative for dysuria, frequency, hematuria and urgency.  Musculoskeletal: Negative for back pain, joint pain, myalgias and neck pain.  Skin: Negative for itching and rash.  Neurological: Negative for dizziness, tremors and weakness.  Endo/Heme/Allergies: Does not bruise/bleed easily.  Psychiatric/Behavioral: Negative for depression. The patient is not nervous/anxious and does not have insomnia.     Objective: BP 100/60   Ht 5\' 4"  (1.626 m)   Wt 165 lb (74.8 kg)   LMP 11/06/2018   BMI 28.32 kg/m   Filed Weights   11/19/18 0826  Weight: 165 lb (74.8 kg)   Body mass index is 28.32 kg/m. Physical Exam Constitutional:      General: She is not in acute distress.    Appearance: She is well-developed.  Genitourinary:     Pelvic exam was performed with patient supine.     Vagina, uterus and rectum normal.     No lesions in the vagina.     No vaginal bleeding.     No cervical motion tenderness, friability, lesion or polyp.     Uterus is mobile.     Uterus  is not enlarged.     No uterine mass detected.    Uterus is midaxial.     No right or left adnexal mass present.     Right adnexa not tender.     Left adnexa not tender.  HENT:     Head: Normocephalic and atraumatic. No laceration.     Right Ear: Hearing normal.     Left Ear: Hearing normal.     Mouth/Throat:     Pharynx: Uvula midline.  Eyes:     Pupils: Pupils are equal, round, and reactive to light.  Neck:     Musculoskeletal: Normal range of motion and neck supple.     Thyroid: No thyromegaly.  Cardiovascular:     Rate and Rhythm: Normal rate and regular rhythm.     Heart  sounds: No murmur. No friction rub. No gallop.   Pulmonary:     Effort: Pulmonary effort is normal. No respiratory distress.     Breath sounds: Normal breath sounds. No wheezing.  Chest:     Breasts:        Right: No mass, skin change or tenderness.        Left: No mass, skin change or tenderness.  Abdominal:     General: Bowel sounds are normal. There is no distension.     Palpations: Abdomen is soft.     Tenderness: There is no abdominal tenderness. There is no rebound.  Musculoskeletal: Normal range of motion.  Neurological:     Mental Status: [name], place, and time.     Cranial Nerves: No cranial nerve deficit.  Skin:    General: Skin is warm and dry.  Psychiatric:        Judgment: Judgment normal.  Vitals signs reviewed.     Assessment:  ANNUAL EXAM 1. Women's annual routine gynecological examination   2. Screening for cervical cancer   3. Encounter for surveillance of transdermal patch hormonal contraceptive device     Screening Plan:            1.  Cervical Screening-  Pap smear done today  2. Breast screening- Exam annually and mammogram>40 planned   3. Colonoscopy every 10 years, Hemoccult testing - after age 21  4. Labs managed by PCP  5. Counseling for contraception: All options discussed today   - norelgestromin-ethinyl estradiol [name]) 150-35 MCG/24HR transdermal patch; Place 1 patch onto the skin once a week. One patch weekly for 3 weeks, then one week off.  Dispense: 3 patch; Refill: 12      F/U  Return in about 1 year (around 11/19/2019) for Annual.  Barnett Applebaum, MD, [facility], [facility] 11/19/2018  8:46 AM

## 2018-11-19 NOTE — Patient Instructions (Signed)
Ethinyl Estradiol; Norelgestromin skin patches What is this medicine? ETHINYL ESTRADIOL;NORELGESTROMIN (ETH in il es tra DYE ole; nor el JES troe min) skin patch is used as a contraceptive (birth control method). This medicine combines two types of female hormones, an estrogen and a progestin. This patch is used to prevent ovulation and pregnancy. This medicine may be used for other purposes; ask your health care provider or pharmacist if you have questions. COMMON BRAND NAME(S): Ortho Evra, Xulane What should I tell my health care provider before I take this medicine? They need to know if you have or ever had any of these conditions:  abnormal vaginal bleeding  blood vessel disease or blood clots  breast, cervical, endometrial, ovarian, liver, or uterine cancer  diabetes  gallbladder disease  having surgery  heart disease or recent heart attack  high blood pressure  high cholesterol or triglycerides  history of irregular heartbeat or heart valve problems  kidney disease  liver disease  migraine headaches  protein C deficiency  protein S deficiency  recently had a baby, miscarriage, or abortion  stroke  systemic lupus erythematosus (SLE)  tobacco smoker  an unusual or allergic reaction to estrogens, progestins, other medicines, foods, dyes, or preservatives  pregnant or trying to get pregnant  breast-feeding How should I use this medicine? This patch is applied to the skin. Follow the directions on the prescription label. Apply to clean, dry, healthy skin on the buttock, abdomen, upper outer arm or upper torso, in a place where it will not be rubbed by tight clothing. Do not use lotions or other cosmetics on the site where the patch will go. Press the patch firmly in place for 10 seconds to ensure good contact with the skin. Change the patch every 7 days on the same day of the week for 3 weeks. You will then have a break from the patch for 1 week, after which you  will apply a new patch. Do not use your medicine more often than directed. Contact your pediatrician regarding the use of this medicine in children. Special care may be needed. This medicine has been used in female children who have started having menstrual periods. A patient package insert for the product will be given with each prescription and refill. Read this sheet carefully each time. The sheet may change frequently. Overdosage: If you think you have taken too much of this medicine contact a poison control center or emergency room at once. NOTE: This medicine is only for you. Do not share this medicine with others. What if I miss a dose? You will need to replace your patch once a week as directed. If your patch is lost or falls off, contact your health care professional for advice. You may need to use another form of birth control if your patch has been off for more than 1 day. What may interact with this medicine? Do not take this medicine with the following medications:  dasabuvir; ombitasvir; paritaprevir; ritonavir  ombitasvir; paritaprevir; ritonavir This medicine may also interact with the following medications:  acetaminophen  antibiotics or medicines for infections, especially rifampin, rifabutin, rifapentine, and possibly penicillins or tetracyclines  aprepitant or fosaprepitant  armodafinil  ascorbic acid (vitamin C)  barbiturate medicines, such as phenobarbital or primidone  bosentan  certain antiviral medicines for hepatitis, HIV or AIDS  certain medicines for cancer treatment  certain medicines for seizures like carbamazepine, clobazam, felbamate, lamotrigine, oxcarbazepine, phenytoin, rufinamide, topiramate  certain medicines for treating high cholesterol  cyclosporine    dantrolene  elagolix  flibanserin  grapefruit juice  lesinurad  medicines for diabetes  medicines to treat fungal infections, such as griseofulvin, miconazole, fluconazole,  ketoconazole, itraconazole, posaconazole or voriconazole  mifepristone  mitotane  modafinil  morphine  mycophenolate  St. John's wort  tamoxifen  temazepam  theophylline or aminophylline  thyroid hormones  tizanidine  tranexamic acid  ulipristal  warfarin This list may not describe all possible interactions. Give your health care provider a list of all the medicines, herbs, non-prescription drugs, or dietary supplements you use. Also tell them if you smoke, drink alcohol, or use illegal drugs. Some items may interact with your medicine. What should I watch for while using this medicine? Visit your doctor or health care professional for regular checks on your progress. You will need a regular breast and pelvic exam and Pap smear while on this medicine. Use an additional method of contraception during the first cycle that you use this patch. If you have any reason to think you are pregnant, stop using this medicine right away and contact your doctor or health care professional. If you are using this medicine for hormone related problems, it may take several cycles of use to see improvement in your condition. Smoking increases the risk of getting a blood clot or having a stroke while you are using hormonal birth control, especially if you are more than 30 years old. You are strongly advised not to smoke. This medicine can make your body retain fluid, making your fingers, hands, or ankles swell. Your blood pressure can go up. Contact your doctor or health care professional if you feel you are retaining fluid. This medicine can make you more sensitive to the sun. Keep out of the sun. If you cannot avoid being in the sun, wear protective clothing and use sunscreen. Do not use sun lamps or tanning beds/booths. If you wear contact lenses and notice visual changes, or if the lenses begin to feel uncomfortable, consult your eye care specialist. In some women, tenderness, swelling, or  minor bleeding of the gums may occur. Notify your dentist if this happens. Brushing and flossing your teeth regularly may help limit this. See your dentist regularly and inform your dentist of the medicines you are taking. If you are going to have elective surgery or a MRI, you may need to stop using this medicine before the surgery or MRI. Consult your health care professional for advice. This medicine does not protect you against HIV infection (AIDS) or any other sexually transmitted diseases. What side effects may I notice from receiving this medicine? Side effects that you should report to your doctor or health care professional as soon as possible:  allergic reactions such as skin rash or itching, hives, swelling of the lips, mouth, tongue, or throat  breast tissue changes or discharge  dark patches of skin on your forehead, cheeks, upper lip, and chin  depression  high blood pressure  migraines or severe, sudden headaches  missed menstrual periods  signs and symptoms of a blood clot such as breathing problems; changes in vision; chest pain; severe, sudden headache; pain, swelling, warmth in the leg; trouble speaking; sudden numbness or weakness of the face, arm or leg  skin reactions at the patch site such as blistering, bleeding, itching, rash, or swelling  stomach pain  yellowing of the eyes or skin Side effects that usually do not require medical attention (report these to your doctor or health care professional if they continue or are bothersome):    breast tenderness  irregular vaginal bleeding or spotting, particularly during the first 3 months of use  headache  nausea  painful menstrual periods  skin redness or mild irritation at site where applied  weight gain (slight) This list may not describe all possible side effects. Call your doctor for medical advice about side effects. You may report side effects to FDA at 1-800-FDA-1088. Where should I keep my  medicine? Keep out of the reach of children. Store at room temperature between 15 and 30 degrees C (59 and 86 degrees F). Keep the patch in its pouch until time of use. Throw away any unused medicine after the expiration date. Dispose of used patches properly. Since a used patch may still contain active hormones, fold the patch in half so that it sticks to itself prior to disposal. Throw away in a place where children or pets cannot reach. NOTE: This sheet is a summary. It may not cover all possible information. If you have questions about this medicine, talk to your doctor, pharmacist, or health care provider.  2020 [employer]vier/Gold Standard (2018-05-22 11:56:29)  

## 2018-11-21 LAB — CYTOLOGY - PAP
Diagnosis: NEGATIVE
High risk HPV: NEGATIVE
Molecular Disclaimer: 56
Molecular Disclaimer: DETECTED
Molecular Disclaimer: NORMAL

## 2019-01-16 ENCOUNTER — Emergency Department: Payer: BC Managed Care – PPO

## 2019-01-16 ENCOUNTER — Encounter: Payer: Self-pay | Admitting: Medical Oncology

## 2019-01-16 ENCOUNTER — Other Ambulatory Visit: Payer: Self-pay

## 2019-01-16 ENCOUNTER — Observation Stay
Admission: EM | Admit: 2019-01-16 | Discharge: 2019-01-17 | Disposition: A | Discharge status: Home / Self Care | Payer: BC Managed Care – PPO | Attending: General Surgery | Admitting: General Surgery

## 2019-01-16 DIAGNOSIS — R1010 Upper abdominal pain, unspecified: Secondary | ICD-10-CM

## 2019-01-16 DIAGNOSIS — R109 Unspecified abdominal pain: Secondary | ICD-10-CM | POA: Diagnosis present

## 2019-01-16 DIAGNOSIS — K8000 Calculus of gallbladder with acute cholecystitis without obstruction: Secondary | ICD-10-CM | POA: Diagnosis present

## 2019-01-16 DIAGNOSIS — K8010 Calculus of gallbladder with chronic cholecystitis without obstruction: Secondary | ICD-10-CM | POA: Diagnosis not present

## 2019-01-16 DIAGNOSIS — Z20828 Contact with and (suspected) exposure to other viral communicable diseases: Secondary | ICD-10-CM | POA: Insufficient documentation

## 2019-01-16 DIAGNOSIS — K810 Acute cholecystitis: Secondary | ICD-10-CM

## 2019-01-16 HISTORY — DX: Calculus of gallbladder with acute cholecystitis without obstruction: K80.00

## 2019-01-16 LAB — CBC
HCT: 41.3 % (ref 36.0–46.0)
Hemoglobin: 13.8 g/dL (ref 12.0–15.0)
MCH: 29.7 pg (ref 26.0–34.0)
MCHC: 33.4 g/dL (ref 30.0–36.0)
MCV: 88.8 fL (ref 80.0–100.0)
Platelets: 239 10*3/uL (ref 150–400)
RBC: 4.65 MIL/uL (ref 3.87–5.11)
RDW: 12.5 % (ref 11.5–15.5)
WBC: 6.7 10*3/uL (ref 4.0–10.5)
nRBC: 0 % (ref 0.0–0.2)

## 2019-01-16 LAB — COMPREHENSIVE METABOLIC PANEL
ALT: 31 U/L (ref 0–44)
AST: 66 U/L — ABNORMAL HIGH (ref 15–41)
Albumin: 4.6 g/dL (ref 3.5–5.0)
Alkaline Phosphatase: 39 U/L (ref 38–126)
Anion gap: 13 (ref 5–15)
BUN: 10 mg/dL (ref 6–20)
CO2: 23 mmol/L (ref 22–32)
Calcium: 10.1 mg/dL (ref 8.9–10.3)
Chloride: 100 mmol/L (ref 98–111)
Creatinine, Ser: 0.94 mg/dL (ref 0.44–1.00)
GFR calc Af Amer: >60 mL/min (ref >60)
GFR calc non Af Amer: >60 mL/min (ref >60)
Glucose, Bld: 109 mg/dL — ABNORMAL HIGH (ref 70–99)
Potassium: 3.9 mmol/L (ref 3.5–5.1)
Sodium: 136 mmol/L (ref 135–145)
Total Bilirubin: 1 mg/dL (ref 0.3–1.2)
Total Protein: 8.3 g/dL — ABNORMAL HIGH (ref 6.5–8.1)

## 2019-01-16 LAB — URINALYSIS, COMPLETE (UACMP) WITH MICROSCOPIC
Bilirubin Urine: NEGATIVE
Glucose, UA: NEGATIVE mg/dL
Hgb urine dipstick: NEGATIVE
Ketones, ur: 5 mg/dL — AB
Leukocytes,Ua: NEGATIVE
Nitrite: NEGATIVE
Protein, ur: NEGATIVE mg/dL
Specific Gravity, Urine: 1.019 (ref 1.005–1.030)
pH: 8 (ref 5.0–8.0)

## 2019-01-16 LAB — POCT PREGNANCY, URINE: Preg Test, Ur: NEGATIVE

## 2019-01-16 LAB — LIPASE, BLOOD: Lipase: 20 U/L (ref 11–51)

## 2019-01-16 LAB — HIV ANTIBODY (ROUTINE TESTING W REFLEX): HIV Screen 4th Generation wRfx: NONREACTIVE

## 2019-01-16 LAB — SARS CORONAVIRUS 2 BY RT PCR (HOSPITAL ORDER, PERFORMED IN ~~LOC~~ HOSPITAL LAB): SARS Coronavirus 2: NEGATIVE

## 2019-01-16 MED ORDER — SODIUM CHLORIDE 0.9 % IV SOLN
2.0000 g | INTRAVENOUS | Status: DC
  Administered 2019-01-16: 16:00:00 2 g via INTRAVENOUS
  Filled 2019-01-16 (×2): qty 20

## 2019-01-16 MED ORDER — ONDANSETRON 4 MG PO TBDP: 4.0000 mg | ORAL_TABLET | Freq: Four times a day (QID) | ORAL | Status: DC | PRN

## 2019-01-16 MED ORDER — MORPHINE SULFATE (PF) 4 MG/ML IV SOLN
4.0000 mg | Freq: Once | INTRAVENOUS | Status: AC
  Administered 2019-01-16: 4 mg via INTRAVENOUS
  Filled 2019-01-16: qty 1

## 2019-01-16 MED ORDER — ONDANSETRON HCL 4 MG/2ML IJ SOLN
4.0000 mg | Freq: Once | INTRAMUSCULAR | Status: AC
  Administered 2019-01-16: 11:00:00 4 mg via INTRAVENOUS
  Filled 2019-01-16: qty 2

## 2019-01-16 MED ORDER — LACTATED RINGERS IV SOLN
INTRAVENOUS | Status: DC
  Administered 2019-01-16 – 2019-01-17 (×2): via INTRAVENOUS

## 2019-01-16 MED ORDER — ONDANSETRON HCL 4 MG/2ML IJ SOLN
4.0000 mg | Freq: Four times a day (QID) | INTRAMUSCULAR | Status: DC | PRN
  Administered 2019-01-17: 4 mg via INTRAVENOUS

## 2019-01-16 MED ORDER — HYDROMORPHONE HCL 1 MG/ML IJ SOLN
0.5000 mg | INTRAMUSCULAR | Status: DC | PRN
  Administered 2019-01-17: 11:00:00 0.5 mg via INTRAVENOUS
  Filled 2019-01-16: qty 1

## 2019-01-16 NOTE — ED Triage Notes (Signed)
Pt reports bilt flank pain that began last night- reports hx of kidney infections and this feels similar. Pt states that she has had one episode of vomiting this am.

## 2019-01-16 NOTE — ED Notes (Signed)
Pt assisted to restroom.  

## 2019-01-16 NOTE — ED Notes (Signed)
Surgeon at bedside.  

## 2019-01-16 NOTE — H&P (Signed)
Reason for Consult:RUQ pain Referring Physician: Jene Every, MD (Emergency Medicine)  Sheila Fox is an 28 y.o. female.  HPI: She presented to the emergency department today with acute onset of right upper quadrant and epigastric pain.  She says that it initially felt like heartburn, starting after dinner yesterday.  It became worse and seemed more right-sided over the subsequent hours.  It now radiates through to her back.  She did not have any fevers or chills.  She has been nauseated since it started.  She did have emesis last night which was nonbilious and nonbloody.  No diarrhea.  A right upper quadrant ultrasound was performed in the emergency department that suggested some focal edema and gallbladder wall thickening at the interface with the liver, as well as a 1 cm stone.  General surgery has been consulted for further evaluation and management of early acute calculus cholecystitis.  Her labs are relatively normal, specifically her white blood cell count is within normal limits and there is no evidence of biliary obstruction based upon her transaminases and bilirubin.  Past Medical History:  Diagnosis Date  . Hx of abnormal cervical Pap smear     Past Surgical History:  Procedure Laterality Date  . CESAREAN SECTION N/A 09/27/2017   Procedure: CESAREAN SECTION;  Surgeon: Nadara Mustard, MD;  Location: [facility] ORS;  Service: Obstetrics;  Laterality: N/A;  Female born @ [birth year] Apgars:9/9 Weight: 9lbs 8 ozs  . FOOT SURGERY Left 2007  . WISDOM TOOTH EXTRACTION  2009   all four    Family History  Problem Relation Age of Onset  . Hyperlipidemia Sister     Social History:  reports that she has never smoked. She has never used smokeless tobacco. She reports previous alcohol use. She reports that she does not use drugs.  Allergies: No Known Allergies  Medications: I have reviewed the patient's current medications.  Results for orders placed or performed during the hospital  encounter of 01/16/19 (from the past 48 hour(s))  Lipase, blood     Status: None   Collection Time: 01/16/19  9:34 AM  Result Value Ref Range   Lipase 20 11 - 51 U/L    Comment: Performed at [facility], [address] [place], [place] [address]  Comprehensive metabolic panel     Status: Abnormal   Collection Time: 01/16/19  9:34 AM  Result Value Ref Range   Sodium 136 135 - 145 mmol/L   Potassium 3.9 3.5 - 5.1 mmol/L   Chloride 100 98 - 111 mmol/L   CO2 23 22 - 32 mmol/L   Glucose, Bld 109 (H) 70 - 99 mg/dL   BUN 10 6 - 20 mg/dL   Creatinine, Ser 1.50 0.44 - 1.00 mg/dL   Calcium 56.9 8.9 - 79.4 mg/dL   Total Protein 8.3 (H) 6.5 - 8.1 g/dL   Albumin 4.6 3.5 - 5.0 g/dL   AST 66 (H) 15 - 41 U/L   ALT 31 0 - 44 U/L   Alkaline Phosphatase 39 38 - 126 U/L   Total Bilirubin 1.0 0.3 - 1.2 mg/dL   GFR calc non Af Amer >60 >60 mL/min   GFR calc Af Amer >60 >60 mL/min   Anion gap 13 5 - 15    Comment: Performed at [facility], [address], [place], [place] [address]  CBC     Status: None   Collection Time: 01/16/19  9:34 AM  Result Value Ref Range   WBC  6.7 4.0 - 10.5 K/uL   RBC 4.65 3.87 - 5.11 MIL/uL   Hemoglobin 13.8 12.0 - 15.0 g/dL   HCT 40.941.3 81.136.0 - 91.446.0 %   MCV 88.8 80.0 - 100.0 fL   MCH 29.7 26.0 - 34.0 pg   MCHC 33.4 30.0 - 36.0 g/dL   RDW 78.212.5 95.611.5 - 21.315.5 %   Platelets 239 150 - 400 K/uL   nRBC 0.0 0.0 - 0.2 %    Comment: Performed at [facility], [address] [place], [place] [address]  Urinalysis, Complete w Microscopic     Status: Abnormal   Collection Time: 01/16/19  9:34 AM  Result Value Ref Range   Color, Urine YELLOW (A) YELLOW   APPearance CLEAR (A) CLEAR   Specific Gravity, Urine 1.019 1.005 - 1.030   pH 8.0 5.0 - 8.0   Glucose, UA NEGATIVE NEGATIVE mg/dL   Hgb urine dipstick NEGATIVE NEGATIVE   Bilirubin Urine NEGATIVE NEGATIVE   Ketones, ur 5 (A) NEGATIVE mg/dL   Protein, ur NEGATIVE NEGATIVE mg/dL   Nitrite  NEGATIVE NEGATIVE   Leukocytes,Ua NEGATIVE NEGATIVE   RBC / HPF 0-5 0 - 5 RBC/hpf   WBC, UA 0-5 0 - 5 WBC/hpf   Bacteria, UA RARE (A) NONE SEEN   Squamous Epithelial / LPF 0-5 0 - 5   Mucus PRESENT     Comment: Performed at [facility], [address] [place], [place] [address]  Pregnancy, urine POC     Status: None   Collection Time: 01/16/19  9:38 AM  Result Value Ref Range   Preg Test, Ur NEGATIVE NEGATIVE    Comment:        THE SENSITIVITY OF THIS METHODOLOGY IS >24 mIU/mL     [place] Abdomen Limited Ruq  Result Date: 01/16/2019 CLINICAL DATA:  28 year old with upper abdominal pain. EXAM: ULTRASOUND ABDOMEN LIMITED RIGHT UPPER QUADRANT COMPARISON:  None. FINDINGS: Gallbladder: Gallbladder is distended. There is elongated flat echogenic structure in the gallbladder which could represent a 1 cm stone. Concern for edematous tissue between the gallbladder and the adjacent liver. Reportedly, the patient does not have a sonographic Murphy sign. Common bile duct: Diameter: 0.3 cm Liver: No focal lesion identified. Within normal limits in parenchymal echogenicity. Portal vein is patent on color Doppler imaging with normal direction of blood flow towards the liver. Other: None. IMPRESSION: Gallbladder is distended with a questionable 1 cm stone. There is concern for edematous tissue or focal wall thickening between the gallbladder and liver. No evidence for diffuse gallbladder wall thickening. Findings are equivocal for cholecystitis and the patient does not have a sonographic Murphy sign. Consider further characterization with nuclear medicine hepatobiliary examination and to evaluate for cholecystitis and cystic duct obstruction. Normal appearance of the liver.  No biliary dilatation. Electronically Signed   By: Richarda OverlieAdam  Henn M.D.   On: 01/16/2019 12:20    Review of Systems  All other systems reviewed and are negative.  Blood pressure 131/72, pulse 62, temperature 98.8 F (37.1 C),  temperature source Oral, resp. rate 18, height 5\' 4"  (1.626 m), weight 74.8 kg, SpO2 100 %, unknown if currently breastfeeding. Physical Exam  Constitutional: She is oriented to person, place, and time. She appears well-developed and well-nourished.  HENT:  Head: Normocephalic and atraumatic.  Mouth and nose are covered with a mask secondary to COVID-19 precautions  Eyes: Right eye exhibits no discharge. Left eye exhibits no discharge. Scleral icterus is present.  Neck: Tracheal deviation present.  Cardiovascular: Normal rate and regular rhythm.  Respiratory: Effort normal. No stridor. No respiratory distress.  GI: Soft. She exhibits no distension. There is abdominal tenderness. There is no rebound and no guarding.  She has tenderness to deep palpation in the right upper quadrant.  Murphy sign is negative.  Genitourinary:    Genitourinary Comments: Deferred   Musculoskeletal:        General: No deformity or edema.  Neurological: [name], place, and time.  Skin: Skin is warm and dry.  Psychiatric: She has a normal mood and affect. Her behavior is normal.    Assessment/Plan: This is a 28 year old woman with right upper quadrant pain and ultrasound findings suggestive of early acute calculus cholecystitis.  I have recommended that we proceed to the operating room for laparoscopic cholecystectomy. I discussed the procedure in detail.  We discussed the risks and benefits of a laparoscopic cholecystectomy and possible cholangiogram including, but not limited to: bleeding, infection, injury to surrounding structures such as the intestine or liver, bile leak, retained gallstones, need to convert to an open procedure, prolonged diarrhea, blood clots such as DVT, common bile duct injury, anesthesia risks, and possible need for additional procedures. The patient had the opportunity to ask any questions and these were answered to her satisfaction.  -Rapid COVID-19 test is  pending. -Patient last ate (coffee with cream) at 8:00 this morning. -We will proceed to the operating room pending anesthesia and OR availability, once Covid results are available.  Fredirick Maudlin 01/16/2019, 1:23 PM

## 2019-01-16 NOTE — ED Notes (Addendum)
This RN at bedside, introduced self to patient and family member at bedside. Pt provided with a spoon and ginger ale per her request. Pt denies further needs at this time. Will continue to monitor for further patient needs.

## 2019-01-16 NOTE — ED Notes (Signed)
ED Provider at bedside. 

## 2019-01-16 NOTE — ED Provider Notes (Signed)
[facility] Emergency Department Provider Note   ____________________________________________    I have reviewed the triage vital signs and the nursing notes.   HISTORY  Chief Complaint Abdominal pain    HPI Sheila Fox is a 28 y.o. female who presents with complaints of epigastric upper abdominal pain which started yesterday after eating dinner.  She reports it continued all night made it difficult for her to sleep.  She felt worse today.  She is never had this pain before.  History of C-section no other abdominal surgeries.  No fevers or chills.  1 episode of vomiting.  Does describe some pain in her back as well bilaterally.  No urinary symptoms.  Is not take anything for this  Past Medical History:  Diagnosis Date  . Hx of abnormal cervical Pap smear     Patient Active Problem List   Diagnosis Date Noted  . Cesarean delivery delivered 11/08/2017    Past Surgical History:  Procedure Laterality Date  . CESAREAN SECTION N/A 09/27/2017   Procedure: CESAREAN SECTION;  Surgeon: Nadara Mustard, MD;  Location: [facility] ORS;  Service: Obstetrics;  Laterality: N/A;  Female born @ [birth year] Apgars:9/9 Weight: 9lbs 8 ozs  . FOOT SURGERY Left 2007  . WISDOM TOOTH EXTRACTION  2009   all four    Prior to Admission medications   Medication Sig Start Date End Date Taking? Authorizing Provider  cetirizine (ZYRTEC) 10 MG tablet Take 10 mg by mouth daily.    [provider]  norelgestromin-ethinyl estradiol Burr Medico) 150-35 MCG/24HR transdermal patch Place 1 patch onto the skin once a week. One patch weekly for 3 weeks, then one week off. 11/19/18   Nadara Mustard, MD  Prenatal Vit-Fe Fumarate-FA (MULTIVITAMIN-PRENATAL) 27-0.8 MG TABS tablet Take 1 tablet by mouth daily at 12 noon.    [provider]     Allergies Patient has no known allergies.  Family History  Problem Relation Age of Onset  . Hyperlipidemia Sister     Social History  Social History   Tobacco Use  . Smoking status: Never Smoker  . Smokeless tobacco: Never Used  Substance Use Topics  . Alcohol use: Not Currently    Comment: social  . Drug use: No    Review of Systems  Constitutional: No fever/chills Eyes: No visual changes.  ENT: No sore throat. Cardiovascular: Denies chest pain. Respiratory: Denies shortness of breath. Gastrointestinal: As above Genitourinary: No dysuria no frequency no hematuria Musculoskeletal: As above Skin: Negative for rash. Neurological: Negative for headaches    ____________________________________________   PHYSICAL EXAM:  VITAL SIGNS: ED Triage Vitals [01/16/19 0931]  Enc Vitals Group     BP 131/72     Pulse Rate 62     Resp 18     Temp 98.8 F (37.1 C)     Temp Source Oral     SpO2 100 %     Weight 74.8 kg (165 lb)     Height 1.626 m (5\' 4" )     Head Circumference      Peak Flow      Pain Score 10     Pain Loc      Pain Edu?      Excl. in GC?     Constitutional: Alert and oriented  Nose: No congestion/rhinnorhea. Mouth/Throat: Mucous membranes are moist.   Neck:  Painless ROM Cardiovascular: Normal rate, regular rhythm. Grossly normal heart sounds.  Good peripheral circulation. Respiratory: Normal respiratory effort.  No retractions. Lungs CTAB. Gastrointestinal: Mild tenderness right upper quadrant and epigastrium, no distention, no CVA tenderness Genitourinary: deferred Musculoskeletal: .  Warm and well perfused Neurologic:  Normal speech and language. No gross focal neurologic deficits are appreciated.  Skin:  Skin is warm, dry and intact. No rash noted. Psychiatric: Mood and affect are normal. Speech and behavior are normal.  ____________________________________________   LABS (all labs ordered are listed, but only abnormal results are displayed)  Labs Reviewed  COMPREHENSIVE METABOLIC PANEL - Abnormal; Notable for the following components:      Result Value   Glucose, Bld 109  (*)    Total Protein 8.3 (*)    AST 66 (*)    All other components within normal limits  URINALYSIS, COMPLETE (UACMP) WITH MICROSCOPIC - Abnormal; Notable for the following components:   Color, Urine YELLOW (*)    APPearance CLEAR (*)    Ketones, ur 5 (*)    Bacteria, UA RARE (*)    All other components within normal limits  LIPASE, BLOOD  CBC  POC URINE PREG, ED  POCT PREGNANCY, URINE   ____________________________________________  EKG  None ____________________________________________  RADIOLOGY  Ultrasound abdomen ____________________________________________   PROCEDURES  Procedure(s) performed: No  Procedures   Critical Care performed: No ____________________________________________   INITIAL IMPRESSION / ASSESSMENT AND PLAN / ED COURSE  Pertinent labs & imaging results that were available during my care of the patient were reviewed by me and considered in my medical decision making (see chart for details).  Patient presents with epigastric pain, she has tenderness in this area, differential includes gastritis/PUD, biliary colic/cholecystitis.  Lipase is normal.  No tenderness in the lower abdomen.  Symptoms started yesterday evening, will give IV morphine, IV Zofran, obtain ultrasound and reevaluate.   Patient much improved after IV morphine   Ultrasound demonstrates distention and pericholecystic fluid suspicious for cholecystitis but somewhat atypical.  I have consulted general surgery to evaluate the patient and recommend next steps.   Dr. Celine Ahr has evaluated patient and recommends surgery, will obtain rapid 2-hour swab    ____________________________________________   FINAL CLINICAL IMPRESSION(S) / ED DIAGNOSES  Final diagnoses:  Upper abdominal pain  Cholecystitis, acute        Note:  This document was prepared using Dragon voice recognition software and may include unintentional dictation errors.   Lavonia Drafts, MD 01/16/19 1310

## 2019-01-17 ENCOUNTER — Observation Stay: Payer: BC Managed Care – PPO | Admitting: Anesthesiology

## 2019-01-17 ENCOUNTER — Encounter
Admission: EM | Disposition: A | Discharge status: Home / Self Care | Payer: Self-pay | Source: Home / Self Care | Attending: Emergency Medicine

## 2019-01-17 ENCOUNTER — Encounter: Payer: Self-pay | Admitting: Anesthesiology

## 2019-01-17 ENCOUNTER — Other Ambulatory Visit: Payer: Self-pay

## 2019-01-17 DIAGNOSIS — K8000 Calculus of gallbladder with acute cholecystitis without obstruction: Secondary | ICD-10-CM

## 2019-01-17 HISTORY — PX: CHOLECYSTECTOMY: SHX55

## 2019-01-17 SURGERY — LAPAROSCOPIC CHOLECYSTECTOMY
Anesthesia: General

## 2019-01-17 MED ORDER — LACTATED RINGERS IV SOLN
INTRAVENOUS | Status: DC | PRN
  Administered 2019-01-17: 13:00:00 via INTRAVENOUS

## 2019-01-17 MED ORDER — GLYCOPYRROLATE 0.2 MG/ML IJ SOLN
INTRAMUSCULAR | Status: DC | PRN
  Administered 2019-01-17 (×2): 0.2 mg via INTRAVENOUS

## 2019-01-17 MED ORDER — FENTANYL CITRATE (PF) 100 MCG/2ML IJ SOLN
25.0000 ug | INTRAMUSCULAR | Status: DC | PRN
  Administered 2019-01-17 (×4): 25 ug via INTRAVENOUS

## 2019-01-17 MED ORDER — LIDOCAINE HCL (PF) 2 % IJ SOLN
INTRAMUSCULAR | Status: AC
  Filled 2019-01-17: qty 10

## 2019-01-17 MED ORDER — KETOROLAC TROMETHAMINE 30 MG/ML IJ SOLN
INTRAMUSCULAR | Status: AC
  Filled 2019-01-17: qty 1

## 2019-01-17 MED ORDER — MIDAZOLAM HCL 2 MG/2ML IJ SOLN
INTRAMUSCULAR | Status: AC
  Filled 2019-01-17: qty 2

## 2019-01-17 MED ORDER — LIDOCAINE-EPINEPHRINE 1 %-1:100000 IJ SOLN
INTRAMUSCULAR | Status: DC | PRN
  Administered 2019-01-17: 17 mL via INTRAMUSCULAR

## 2019-01-17 MED ORDER — PROPOFOL 10 MG/ML IV BOLUS
INTRAVENOUS | Status: DC | PRN
  Administered 2019-01-17: 150 mg via INTRAVENOUS

## 2019-01-17 MED ORDER — FENTANYL CITRATE (PF) 100 MCG/2ML IJ SOLN
INTRAMUSCULAR | Status: AC
  Filled 2019-01-17: qty 2

## 2019-01-17 MED ORDER — FENTANYL CITRATE (PF) 100 MCG/2ML IJ SOLN
INTRAMUSCULAR | Status: AC
  Administered 2019-01-17: 14:00:00 25 ug via INTRAVENOUS
  Filled 2019-01-17: qty 2

## 2019-01-17 MED ORDER — OXYCODONE HCL 5 MG PO TABS: 5.0000 mg | ORAL_TABLET | Freq: Four times a day (QID) | ORAL | 0 refills | Status: DC | PRN

## 2019-01-17 MED ORDER — CEFAZOLIN SODIUM-DEXTROSE 1-4 GM/50ML-% IV SOLN
INTRAVENOUS | Status: DC | PRN
  Administered 2019-01-17: 1 g via INTRAVENOUS

## 2019-01-17 MED ORDER — ROCURONIUM BROMIDE 100 MG/10ML IV SOLN
INTRAVENOUS | Status: DC | PRN
  Administered 2019-01-17: 40 mg via INTRAVENOUS
  Administered 2019-01-17: 10 mg via INTRAVENOUS

## 2019-01-17 MED ORDER — SUGAMMADEX SODIUM 200 MG/2ML IV SOLN
INTRAVENOUS | Status: AC
  Filled 2019-01-17: qty 2

## 2019-01-17 MED ORDER — FENTANYL CITRATE (PF) 100 MCG/2ML IJ SOLN
INTRAMUSCULAR | Status: DC | PRN
  Administered 2019-01-17: 50 ug via INTRAVENOUS

## 2019-01-17 MED ORDER — LIDOCAINE HCL (CARDIAC) PF 100 MG/5ML IV SOSY
PREFILLED_SYRINGE | INTRAVENOUS | Status: DC | PRN
  Administered 2019-01-17: 80 mg via INTRAVENOUS

## 2019-01-17 MED ORDER — FENTANYL CITRATE (PF) 250 MCG/5ML IJ SOLN
INTRAMUSCULAR | Status: AC
  Filled 2019-01-17: qty 5

## 2019-01-17 MED ORDER — EPHEDRINE SULFATE 50 MG/ML IJ SOLN
INTRAMUSCULAR | Status: AC
  Filled 2019-01-17: qty 1

## 2019-01-17 MED ORDER — KETOROLAC TROMETHAMINE 30 MG/ML IJ SOLN
INTRAMUSCULAR | Status: DC | PRN
  Administered 2019-01-17: 30 mg via INTRAVENOUS

## 2019-01-17 MED ORDER — SUGAMMADEX SODIUM 200 MG/2ML IV SOLN
INTRAVENOUS | Status: DC | PRN
  Administered 2019-01-17: 149.6 mg via INTRAVENOUS

## 2019-01-17 MED ORDER — OXYCODONE HCL 5 MG PO TABS
5.0000 mg | ORAL_TABLET | Freq: Once | ORAL | Status: AC
  Administered 2019-01-17: 16:00:00 10 mg via ORAL
  Filled 2019-01-17: qty 2

## 2019-01-17 MED ORDER — ACETAMINOPHEN 325 MG PO TABS
650.0000 mg | ORAL_TABLET | Freq: Four times a day (QID) | ORAL | Status: DC | PRN
  Administered 2019-01-17: 16:00:00 650 mg via ORAL
  Filled 2019-01-17: qty 2

## 2019-01-17 MED ORDER — OXYCODONE HCL 5 MG/5ML PO SOLN: 5.0000 mg | Freq: Once | ORAL | Status: DC | PRN

## 2019-01-17 MED ORDER — OXYCODONE HCL 5 MG PO TABS: 5.0000 mg | ORAL_TABLET | Freq: Once | ORAL | Status: DC | PRN

## 2019-01-17 MED ORDER — PROPOFOL 10 MG/ML IV BOLUS
INTRAVENOUS | Status: AC
  Filled 2019-01-17: qty 20

## 2019-01-17 MED ORDER — MIDAZOLAM HCL 2 MG/2ML IJ SOLN
INTRAMUSCULAR | Status: DC | PRN
  Administered 2019-01-17 (×2): 1 mg via INTRAVENOUS

## 2019-01-17 MED ORDER — DEXAMETHASONE SODIUM PHOSPHATE 10 MG/ML IJ SOLN
INTRAMUSCULAR | Status: AC
  Filled 2019-01-17: qty 1

## 2019-01-17 MED ORDER — ROCURONIUM BROMIDE 50 MG/5ML IV SOLN
INTRAVENOUS | Status: AC
  Filled 2019-01-17: qty 1

## 2019-01-17 MED ORDER — IBUPROFEN 800 MG PO TABS: 800.0000 mg | ORAL_TABLET | Freq: Three times a day (TID) | ORAL | 0 refills | Status: DC | PRN

## 2019-01-17 MED ORDER — EPHEDRINE SULFATE 50 MG/ML IJ SOLN
INTRAMUSCULAR | Status: DC | PRN
  Administered 2019-01-17 (×2): 10 mg via INTRAVENOUS

## 2019-01-17 MED ORDER — ONDANSETRON HCL 4 MG/2ML IJ SOLN
INTRAMUSCULAR | Status: AC
  Filled 2019-01-17: qty 2

## 2019-01-17 MED ORDER — DEXAMETHASONE SODIUM PHOSPHATE 10 MG/ML IJ SOLN
INTRAMUSCULAR | Status: DC | PRN
  Administered 2019-01-17: 10 mg via INTRAVENOUS

## 2019-01-17 SURGICAL SUPPLY — 44 items
APPLIER CLIP 5 13 M/L LIGAMAX5 (MISCELLANEOUS) ×2
BLADE SURG SZ11 CARB STEEL (BLADE) ×2 IMPLANT
CANISTER SUCT 1200ML W/VALVE (MISCELLANEOUS) ×2 IMPLANT
CHLORAPREP W/TINT 26 (MISCELLANEOUS) ×2 IMPLANT
CLIP APPLIE 5 13 M/L LIGAMAX5 (MISCELLANEOUS) ×1 IMPLANT
COVER WAND RF STERILE (DRAPES) ×2 IMPLANT
DECANTER SPIKE VIAL GLASS SM (MISCELLANEOUS) ×4 IMPLANT
DEFOGGER SCOPE WARMER CLEARIFY (MISCELLANEOUS) ×2 IMPLANT
DERMABOND ADVANCED (GAUZE/BANDAGES/DRESSINGS) ×1
DERMABOND ADVANCED .7 DNX12 (GAUZE/BANDAGES/DRESSINGS) ×1 IMPLANT
ELECT CAUTERY BLADE TIP 2.5 (TIP) ×2
ELECT REM PT RETURN 9FT ADLT (ELECTROSURGICAL) ×2
ELECTRODE CAUTERY BLDE TIP 2.5 (TIP) ×1 IMPLANT
ELECTRODE REM PT RTRN 9FT ADLT (ELECTROSURGICAL) ×1 IMPLANT
GLOVE BIO SURGEON STRL SZ 6.5 (GLOVE) ×10 IMPLANT
GLOVE INDICATOR 7.0 STRL GRN (GLOVE) ×10 IMPLANT
GOWN STRL REUS W/ TWL LRG LVL3 (GOWN DISPOSABLE) ×5 IMPLANT
GOWN STRL REUS W/TWL LRG LVL3 (GOWN DISPOSABLE) ×5
GRASPER SUT TROCAR 14GX15 (MISCELLANEOUS) ×2 IMPLANT
IRRIGATION STRYKERFLOW (MISCELLANEOUS) ×1 IMPLANT
IRRIGATOR STRYKERFLOW (MISCELLANEOUS) ×2
IV NS 1000ML (IV SOLUTION) ×1
IV NS 1000ML BAXH (IV SOLUTION) ×1 IMPLANT
KIT TURNOVER KIT A (KITS) ×2 IMPLANT
LABEL OR SOLS (LABEL) ×2 IMPLANT
NEEDLE HYPO 22GX1.5 SAFETY (NEEDLE) ×2 IMPLANT
NS IRRIG 500ML POUR BTL (IV SOLUTION) ×2 IMPLANT
PACK LAP CHOLECYSTECTOMY (MISCELLANEOUS) ×2 IMPLANT
PENCIL ELECTRO HAND CTR (MISCELLANEOUS) ×2 IMPLANT
POUCH SPECIMEN RETRIEVAL 10MM (ENDOMECHANICALS) ×2 IMPLANT
SCISSORS METZENBAUM CVD 33 (INSTRUMENTS) ×2 IMPLANT
SET TUBE SMOKE EVAC HIGH FLOW (TUBING) ×2 IMPLANT
SLEEVE ADV FIXATION 5X100MM (TROCAR) ×6 IMPLANT
SOLUTION ELECTROLUBE (MISCELLANEOUS) ×2 IMPLANT
STRIP CLOSURE SKIN 1/2X4 (GAUZE/BANDAGES/DRESSINGS) ×2 IMPLANT
SUT MNCRL 4-0 (SUTURE) ×2
SUT MNCRL 4-0 27XMFL (SUTURE) ×2
SUT VIC AB 3-0 SH 27 (SUTURE) ×1
SUT VIC AB 3-0 SH 27X BRD (SUTURE) ×1 IMPLANT
SUT VICRYL 0 AB UR-6 (SUTURE) ×4 IMPLANT
SUTURE MNCRL 4-0 27XMF (SUTURE) ×2 IMPLANT
TROCAR ADV FIXATION 12X100MM (TROCAR) ×2 IMPLANT
TROCAR Z-THREAD OPTICAL 5X100M (TROCAR) ×2 IMPLANT
WATER STERILE IRR 1000ML POUR (IV SOLUTION) ×2 IMPLANT

## 2019-01-17 NOTE — Discharge Instructions (Signed)
In addition to included general post-operative instructions for laparoscopic cholecystectomy,  Diet: Resume home heart healthy diet. You may notice looser stools with fatty meals while your body adjust to not having a gallblader  Activity: No heavy lifting >20 pounds (children, pets, laundry, garbage) for about 4 weeks, but light activity and walking are encouraged. Do not drive or drink alcohol if taking narcotic pain medications or having pain that might distract from driving.  Wound care: 2 days after surgery (11/21), you may shower/get incision wet with soapy water and pat dry (do not rub incisions), but no baths or submerging incision underwater until follow-up. Do not remove steri-strips, these will fall off on their own.   Medications: Resume all home medications. For mild to moderate pain: acetaminophen (Tylenol) or ibuprofen/naproxen (if no kidney disease). Combining Tylenol with alcohol can substantially increase your risk of causing liver disease. Narcotic pain medications, if prescribed, can be used for severe pain, though may cause nausea, constipation, and drowsiness. Do not combine Tylenol and Percocet (or similar) within a 6 hour period as Percocet (and similar) contain(s) Tylenol. If you do not need the narcotic pain medication, you do not need to fill the prescription.  Call office [phone] / [phone]) at any time if any questions, worsening pain, fevers/chills, bleeding, drainage from incision site, or other concerns.

## 2019-01-17 NOTE — Progress Notes (Signed)
Report called to Olene Craven, RN for patient going to room 349.

## 2019-01-17 NOTE — Progress Notes (Signed)
Patient discharged home. Discharge instructions, prescriptions and follow up appointment given to and reviewed with patient. Patient verbalized understanding.pt wheeled out by NT 

## 2019-01-17 NOTE — Anesthesia Preprocedure Evaluation (Signed)
Anesthesia Evaluation  Patient identified by MRN, date of birth, ID band Patient awake    Reviewed: Allergy & Precautions, H&P , NPO status , Patient's Chart, lab work & pertinent test results  History of Anesthesia Complications Negative for: history of anesthetic complications  Airway Mallampati: II  TM Distance: >3 FB Neck ROM: full    Dental  (+) Chipped   Pulmonary neg pulmonary ROS, neg shortness of breath,           Cardiovascular Exercise Tolerance: Good (-) angina(-) Past MI and (-) DOE negative cardio ROS       Neuro/Psych negative neurological ROS  negative psych ROS   GI/Hepatic negative GI ROS, Neg liver ROS, neg GERD  ,  Endo/Other  negative endocrine ROS  Renal/GU      Musculoskeletal   Abdominal   Peds  Hematology negative hematology ROS (+)   Anesthesia Other Findings Past Medical History: No date: Hx of abnormal cervical Pap smear  Past Surgical History: 09/27/2017: CESAREAN SECTION; N/A     Comment:  Procedure: CESAREAN SECTION;  Surgeon: Gae Dry,              MD;  Location: [facility] ORS;  Service: Obstetrics;                Laterality: N/A;  Female born @ [birth year] Apgars:9/9 Weight:               9lbs 8 ozs 2007: FOOT SURGERY; Left 2009: WISDOM TOOTH EXTRACTION     Comment:  all four  BMI    Body Mass Index: 28.31 kg/m      Reproductive/Obstetrics negative OB ROS                             Anesthesia Physical Anesthesia Plan  ASA: II  Anesthesia Plan: General ETT   Post-op Pain Management:    Induction: Intravenous  PONV Risk Score and Plan: Ondansetron, Dexamethasone, Midazolam and Treatment may vary due to age or medical condition  Airway Management Planned: Oral ETT  Additional Equipment:   Intra-op Plan:   Post-operative Plan: Extubation in OR  Informed Consent: I have reviewed the patients History and Physical, chart, labs and  discussed the procedure including the risks, benefits and alternatives for the proposed anesthesia with the patient or authorized representative who has indicated his/her understanding and acceptance.     Dental Advisory Given  Plan Discussed with: Anesthesiologist, CRNA and Surgeon  Anesthesia Plan Comments: (Patient consented for risks of anesthesia including but not limited to:  - adverse reactions to medications - damage to teeth, lips or other oral mucosa - sore throat or hoarseness - Damage to heart, brain, lungs or loss of life  Patient voiced understanding.)        Anesthesia Quick Evaluation

## 2019-01-17 NOTE — Transfer of Care (Signed)
Immediate Anesthesia Transfer of Care Note  Patient: Sheila Fox  Procedure(s) Performed: LAPAROSCOPIC CHOLECYSTECTOMY (N/A )  Patient Location: PACU  Anesthesia Type:General  Level of Consciousness: drowsy  Airway & Oxygen Therapy: Patient Spontanous Breathing and Patient connected to face mask oxygen  Post-op Assessment: Report given to RN and Post -op Vital signs reviewed and stable  Post vital signs: Reviewed and stable  Last Vitals:  Vitals Value Taken Time  BP 131/60 01/17/19 1359  Temp    Pulse 81 01/17/19 1401  Resp 17 01/17/19 1401  SpO2 100 % 01/17/19 1401  Vitals shown include unvalidated device data.  Last Pain:  Vitals:   01/17/19 1144  TempSrc: Temporal  PainSc: 0-No pain         Complications: No apparent anesthesia complications

## 2019-01-17 NOTE — Progress Notes (Addendum)
[facility] Day(s): 0.   Interval History:  Patient seen and examined no acute events or new complaints overnight.  Patient reports she is starting to have a little RUQ abdominal pain but this is not severe. No fever, chills, nausea or emesis.  No other complaints.  Plan for laparoscopic cholecystectomy this afternoon   Vital signs in last 24 hours: [min-max] current  Temp:  [98.2 F (36.8 C)-99 F (37.2 C)] 98.5 F (36.9 C) (11/19 0605) Pulse Rate:  [49-62] 52 (11/19 0605) Resp:  [16-18] 18 (11/19 0205) BP: (99-131)/(53-72) 99/58 (11/19 0605) SpO2:  [97 %-100 %] 100 % (11/19 0605) Weight:  [74.8 kg] 74.8 kg (11/18 0931)     Height: 5\' 4"  (162.6 cm) Weight: 74.8 kg BMI (Calculated): 28.31   Intake/Output last 2 shifts:  11/18 0701 - 11/19 0700 In: 933.1 [I.V.:833.1; IV Piggyback:100] Out: -    Physical Exam:  Constitutional: alert, cooperative and no distress  Respiratory: breathing non-labored at rest  Cardiovascular: regular rate and sinus rhythm  Gastrointestinal: soft, non-tender, and non-distended, no rebound/guarding Integumentary: warm, dry, no juandice   Labs:  CBC Latest Ref Rng & Units 01/16/2019 09/28/2017 09/26/2017  WBC 4.0 - 10.5 K/uL 6.7 9.7 6.4  Hemoglobin 12.0 - 15.0 g/dL 13.8 9.7(L) 12.4  Hematocrit 36.0 - 46.0 % 41.3 27.2(L) 35.4  Platelets 150 - 400 K/uL 239 152 190   CMP Latest Ref Rng & Units 01/16/2019 09/26/2017  Glucose 70 - 99 mg/dL 109(H) 74  BUN 6 - 20 mg/dL 10 6  Creatinine 0.44 - 1.00 mg/dL 0.94 0.74  Sodium 135 - 145 mmol/L 136 137  Potassium 3.5 - 5.1 mmol/L 3.9 3.5  Chloride 98 - 111 mmol/L 100 107  CO2 22 - 32 mmol/L 23 23  Calcium 8.9 - 10.3 mg/dL 10.1 9.1  Total Protein 6.5 - 8.1 g/dL 8.3(H) 6.8  Total Bilirubin 0.3 - 1.2 mg/dL 1.0 0.5  Alkaline Phos 38 - 126 U/L 39 285(H)  AST 15 - 41 U/L 66(H) 32  ALT 0 - 44 U/L 31 28     Imaging studies: No new pertinent imaging  studies   Assessment/Plan:  28 y.o. female with acute calculus cholecystitis with plans for OR this afternnon   - NPO + IVF  - IV ABx   - pain control prn  - monitor abdominal examination   - Plan for laparoscopic cholecystectomy today with Dr Celine Ahr pending OR/Anesthesia availability   - All risks, benefits, and alternatives to above procedure(s) were discussed with the patient, all of her questions were answered to her expressed satisfaction, patient expresses she wishes to proceed, and informed consent was obtained.  All of the above findings and recommendations were discussed with the patient, and the medical team, and all of patient's questions were answered to her expressed satisfaction.  -- Edison Simon, PA-C [facility] 01/17/2019, 7:38 AM [phone] M-F: 7am - 4pm   I saw and evaluated the patient.  I agree with the above documentation, exam, and plan, which I have edited where appropriate. Fredirick Maudlin  9:28 AM

## 2019-01-17 NOTE — Op Note (Signed)
Laparoscopic Cholecystectomy  Pre-operative Diagnosis: Acute calculous cholecystitis  Post-operative Diagnosis: Same  Procedure: Upper scopic cholecystectomy  Surgeon: Fredirick Maudlin, MD  Anesthesia: GETA  Assistant: Edison Simon, PA-C   Findings: The gallbladder was very edematous and friable, consistent with acute cholecystitis.  Estimated Blood Loss: Less than 10 cc         Drains: None         Specimens: Gallbladder           Complications: none   Procedure Details  The patient was seen again in the preoperative holding area. The benefits, complications, treatment options, and expected outcomes were discussed with the patient. The risks of bleeding, infection, recurrence of symptoms, failure to resolve symptoms, bile duct damage, bile duct leak, retained common bile duct stone, bowel injury, any of which could require further surgery and/or ERCP, stent, or papillotomy were reviewed with the patient. The likelihood of improving the patient's symptoms with return to their baseline status is good.  The patient and/or family concurred with the proposed plan, giving informed consent.  The patient was taken to operating room, identified as Sheila Fox and the procedure verified as Laparoscopic Cholecystectomy. A time out was performed and the above information confirmed.  Prior to the induction of general anesthesia, antibiotic prophylaxis was administered. VTE prophylaxis was in place. General endotracheal anesthesia was then administered and tolerated well. After the induction, the abdomen was prepped with Chloraprep and draped in the sterile fashion. The patient was positioned in the supine position.  Optiview technique was used to enter the abdomen in the right upper quadrant via a 5 mm port. Pneumoperitoneum was then created with CO2 and tolerated well without any adverse changes in the patient's vital signs.  A 12 mm periumbilical port and 2 additional 5-mm ports were  placed in the right upper quadrant all under direct vision. All skin incisions  were infiltrated with a local anesthetic agent before making the incision and placing the trocars.   The patient was positioned  in reverse Trendelenburg, tilted slightly to the patient's left.  The gallbladder was identified, the fundus grasped and retracted cephalad. Adhesions were lysed bluntly. The infundibulum was grasped and retracted laterally, exposing the peritoneum overlying the triangle of Calot. This was then divided and exposed in a blunt fashion. An extended critical view of the cystic duct and cystic artery was obtained.  The cystic duct was clearly identified and bluntly dissected free. Both the cystic artery and duct were double clipped and divided.  The gallbladder was taken from the gallbladder fossa in a retrograde fashion with the electrocautery.  During this portion of the procedure, the patient became bradycardic to a low of 38 bpm.  We dropped the intra-abdominal pressures down to 0, removing our instruments.  She responded well to a dose of glycopyrrolate.  Once her heart rate normalized, we completed removing the gallbladder from the liver.  The gallbladder was removed and placed in an Endo pouch bag. The liver bed was irrigated and inspected. Hemostasis was achieved with the electrocautery. Copious saline irrigation was utilized and was repeatedly aspirated until clear.  The gallbladder and Endo pouch sac were then removed through a port site.   Inspection of the right upper quadrant was performed. No bleeding, bile duct injury or leak, or bowel injury was noted. Pneumoperitoneum was released.  The periumbilical port site was closed with interrumpted 0 Vicryl sutures. 4-0 subcuticular Monocryl was used to close the skin. Dermabond was applied, followed by  Steri-Strips.  The patient was then extubated and brought to the recovery room in stable condition. Sponge, lap, and needle counts were correct at  closure and at the conclusion of the case.               Duanne Guess, MD, FACS

## 2019-01-17 NOTE — Anesthesia Procedure Notes (Signed)
Procedure Name: Intubation Date/Time: 01/17/2019 12:44 PM Performed by: Jerrye Noble, CRNA Pre-anesthesia Checklist: Patient identified, Emergency Drugs available, Suction available and Patient being monitored Patient Re-evaluated:Patient Re-evaluated prior to induction Oxygen Delivery Method: Circle system utilized Preoxygenation: Pre-oxygenation with 100% oxygen Induction Type: IV induction Ventilation: Mask ventilation without difficulty Laryngoscope Size: McGraph and 3 Grade View: Grade I Tube type: Oral Tube size: 7.0 mm Number of attempts: 1 Airway Equipment and Method: Stylet Placement Confirmation: ETT inserted through vocal cords under direct vision,  positive ETCO2 and breath sounds checked- equal and bilateral Secured at: 21 cm Tube secured with: Tape Dental Injury: Teeth and Oropharynx as per pre-operative assessment

## 2019-01-17 NOTE — Progress Notes (Signed)
Pt arrived from ED via wheelchair. Pt denied any pain and VSS. Started fluids, reviewed POC with pt, and NPO status. Pt had no questions at the time. Will continue to monitor.

## 2019-01-17 NOTE — Anesthesia Post-op Follow-up Note (Signed)
Anesthesia QCDR form completed.        

## 2019-01-17 NOTE — Discharge Summary (Addendum)
[facility] SURGICAL DISCHARGE SUMMARY  Patient ID: Sheila Fox MRN: [id number] DOB/AGE: 1990/08/17 28 y.o.  Admit date: 01/16/2019 Discharge date: 01/17/2019  Discharge Diagnoses Patient Active Problem List   Diagnosis Date Noted  . Acute calculous cholecystitis 01/16/2019   Consultants None  Procedures 01/17/2019 Laparoscopic Cholecystectomy  HPI: She presented to the emergency department on 01/16/2019 with acute onset of right upper quadrant and epigastric pain.  She says that it initially felt like heartburn, starting after dinner yesterday.  It became worse and seemed more right-sided over the subsequent hours.  It now radiates through to her back.  She did not have any fevers or chills.  She has been nauseated since it started.  She did have emesis last night which was nonbilious and nonbloody.  No diarrhea.  A right upper quadrant ultrasound was performed in the emergency department that suggested some focal edema and gallbladder wall thickening at the interface with the liver, as well as a 1 cm stone.  General surgery has been consulted for further evaluation and management of early acute calculus cholecystitis.  Her labs are relatively normal, specifically her white blood cell count is within normal limits and there is no evidence of biliary obstruction based upon her transaminases and bilirubin.  Hospital Course: Informed consent was obtained and documented, and patient underwent uneventful laparoscopic cholecystectomy (Dr Celine Ahr, 01/17/2019).  Post-operatively, patient did well and was able to be discharged from PACU. Discharge planning was initiated accordingly with patient safely able to be discharged home with appropriate discharge instructions, pain control, and outpatient follow-up after all of her questions were answered to her expressed satisfaction.   Discharge Condition: Good    Allergies as of 01/17/2019   No Known Allergies      Medication List    TAKE these medications   cetirizine 10 MG tablet Commonly known as: ZYRTEC Take 10 mg by mouth daily.   ibuprofen 800 MG tablet Commonly known as: ADVIL Take 1 tablet (800 mg total) by mouth every 8 (eight) hours as needed.   multivitamin-prenatal 27-0.8 MG Tabs tablet Take 1 tablet by mouth daily at 12 noon.   oxyCODONE 5 MG immediate release tablet Commonly known as: Oxy IR/ROXICODONE Take 1 tablet (5 mg total) by mouth every 6 (six) hours as needed for severe pain.   Xulane 150-35 MCG/24HR transdermal patch Generic drug: norelgestromin-ethinyl estradiol Place 1 patch onto the skin once a week. One patch weekly for 3 weeks, then one week off.        Follow-up Information    Tylene Fantasia, PA-C. Schedule an appointment as soon as possible for a visit in 2 week(s).   Specialty: Physician Assistant Why: s/p lap chole Contact information: [address] [place] [address] [phone]            Time spent on discharge management including discussion of hospital course, clinical condition, outpatient instructions, prescriptions, and follow up with the patient and members of the medical team: >30 minutes  -- Edison Simon , PA-C [facility]  01/17/2019, 1:57 PM [phone] M-F: 7am - 4pm  I saw and evaluated the patient.  I agree with the above documentation, exam, and plan, which I have edited where appropriate. Fredirick Maudlin  2:01 PM

## 2019-01-18 ENCOUNTER — Encounter: Payer: Self-pay | Admitting: General Surgery

## 2019-01-22 LAB — SURGICAL PATHOLOGY

## 2019-01-31 NOTE — Anesthesia Postprocedure Evaluation (Signed)
Anesthesia Post Note  Patient: Sheila Fox  Procedure(s) Performed: LAPAROSCOPIC CHOLECYSTECTOMY (N/A )  Patient location during evaluation: PACU Anesthesia Type: General Level of consciousness: awake and alert Pain management: pain level controlled Vital Signs Assessment: post-procedure vital signs reviewed and stable Respiratory status: spontaneous breathing, nonlabored ventilation and respiratory function stable Cardiovascular status: blood pressure returned to baseline and stable Postop Assessment: no apparent nausea or vomiting Anesthetic complications: no     Last Vitals:  Vitals:   01/17/19 1515 01/17/19 1630  BP: 123/74 120/74  Pulse: 75   Resp: 18 20  Temp: 36.7 C 36.9 C  SpO2: 99% 98%    Last Pain:  Vitals:   01/17/19 1700  TempSrc:   PainSc: 6                  Alyson Ki Harvie Heck

## 2019-02-05 ENCOUNTER — Encounter: Payer: Self-pay | Admitting: Physician Assistant

## 2019-02-05 ENCOUNTER — Other Ambulatory Visit: Payer: Self-pay

## 2019-02-05 ENCOUNTER — Ambulatory Visit (INDEPENDENT_AMBULATORY_CARE_PROVIDER_SITE_OTHER): Payer: BC Managed Care – PPO | Admitting: Physician Assistant

## 2019-02-05 VITALS — BP 126/72 | HR 60 | Temp 97.9°F | Resp 12 | Ht 64.0 in | Wt 169.8 lb

## 2019-02-05 DIAGNOSIS — K8000 Calculus of gallbladder with acute cholecystitis without obstruction: Secondary | ICD-10-CM

## 2019-02-05 DIAGNOSIS — Z09 Encounter for follow-up examination after completed treatment for conditions other than malignant neoplasm: Secondary | ICD-10-CM

## 2019-02-05 NOTE — Patient Instructions (Addendum)
Follow-up with our office as needed. Please call and ask to speak with a nurse if you develop questions or concerns.  Take ammonium as needed to help.   Please see follow up appointments below.    GENERAL POST-OPERATIVE PATIENT INSTRUCTIONS   WOUND CARE INSTRUCTIONS:  Keep a dry clean dressing on the wound if there is drainage. The initial bandage may be removed after 24 hours.  Once the wound has quit draining you may leave it open to air.  If clothing rubs against the wound or causes irritation and the wound is not draining you may cover it with a dry dressing during the daytime.  Try to keep the wound dry and avoid ointments on the wound unless directed to do so.  If the wound becomes bright red and painful or starts to drain infected material that is not clear, please contact your physician immediately.  If the wound is mildly pink and has a thick firm ridge underneath it, this is normal, and is referred to as a healing ridge.  This will resolve over the next 4-6 weeks.  BATHING: You may shower if you have been informed of this by your surgeon. However, Please do not submerge in a tub, hot tub, or pool until incisions are completely sealed or have been told by your surgeon that you may do so.  DIET:  You may eat any foods that you can tolerate.  It is a good idea to eat a high fiber diet and take in plenty of fluids to prevent constipation.  If you do become constipated you may want to take a mild laxative or take ducolax tablets on a daily basis until your bowel habits are regular.  Constipation can be very uncomfortable, along with straining, after recent surgery.  ACTIVITY:  You are encouraged to cough and deep breath or use your incentive spirometer if you were given one, every 15-30 minutes when awake.  This will help prevent respiratory complications and low grade fevers post-operatively if you had a general anesthetic.  You may want to hug a pillow when coughing and sneezing to add  additional support to the surgical area, if you had abdominal or chest surgery, which will decrease pain during these times.  You are encouraged to walk and engage in light activity for the next two weeks.  You should not lift more than 20 pounds for 6 weeks after surgery as it could put you at increased risk for complications.  Twenty pounds is roughly equivalent to a plastic bag of groceries. At that time- Listen to your body when lifting, if you have pain when lifting, stop and then try again in a few days. Soreness after doing exercises or activities of daily living is normal as you get back in to your normal routine.  MEDICATIONS:  Try to take narcotic medications and anti-inflammatory medications, such as tylenol, ibuprofen, naprosyn, etc., with food.  This will minimize stomach upset from the medication.  Should you develop nausea and vomiting from the pain medication, or develop a rash, please discontinue the medication and contact your physician.  You should not drive, make important decisions, or operate machinery when taking narcotic pain medication.  SUNBLOCK Use sun block to incision area over the next year if this area will be exposed to sun. This helps decrease scarring and will allow you avoid a permanent darkened area over your incision.  QUESTIONS:  Please feel free to call our office if you have any questions, and we  will be glad to assist you.

## 2019-02-05 NOTE — Progress Notes (Signed)
[facility] POST-OP OFFICE VISIT  02/05/2019  HPI: Sheila Fox is a 28 y.o. female 19 days s/p laparoscopic cholecystectomy with Dr Celine Ahr.   She is overall doing well No fever, chills, nausea, emesis, or abdominal pain She does report diarrhea, essentially daily no matter what she eats  Vital signs: BP 126/72   Pulse 60   Temp 97.9 F (36.6 C)   Resp 12   Ht 5\' 4"  (1.626 m)   Wt 169 lb 12.8 oz (77 kg)   SpO2 98%   BMI 29.15 kg/m    Physical Exam: Constitutional: Well appearing female, NAD Abdomen: Soft, non-tender, non-distended, no rebound/guarding Skin: Laparoscopic incisions are CDI without erythema or drainage  Assessment/Plan: This is a 28 y.o. female 19 days s/p laparoscopic cholecystectomy   - Will try 2 mg Imodium 2-3x daily  - continue lifting restrictions  - reviewed wound care   - Reviewed pathology: Chronic cholecystitis with cholelithiasis  - rtc in 2 weeks  -- Edison Simon, PA-C [facility] 02/05/2019, 1:44 PM [phone] M-F: 7am - 4pm

## 2019-02-18 ENCOUNTER — Encounter: Payer: Self-pay | Admitting: Physician Assistant

## 2019-02-18 ENCOUNTER — Other Ambulatory Visit: Payer: Self-pay

## 2019-02-18 ENCOUNTER — Ambulatory Visit (INDEPENDENT_AMBULATORY_CARE_PROVIDER_SITE_OTHER): Payer: BC Managed Care – PPO | Admitting: Physician Assistant

## 2019-02-18 VITALS — BP 112/60 | HR 59 | Temp 97.3°F | Resp 12 | Ht 64.0 in | Wt 171.2 lb

## 2019-02-18 DIAGNOSIS — Z09 Encounter for follow-up examination after completed treatment for conditions other than malignant neoplasm: Secondary | ICD-10-CM

## 2019-02-18 DIAGNOSIS — K8000 Calculus of gallbladder with acute cholecystitis without obstruction: Secondary | ICD-10-CM

## 2019-02-18 NOTE — Patient Instructions (Addendum)
You can stop taking the Imodium. You can start working out and lifting weights as desired.   Follow-up with our office as needed.  Please call if you have any questions or concerns.

## 2019-02-18 NOTE — Progress Notes (Signed)
[facility] POST-OP OFFICE VISIT  02/18/2019  HPI: Sheila Fox is a 28 y.o. female  1 month s/p laparoscopic cholecystectomy for acute cholecystitis with Dr Celine Ahr. She was having issues with diarrhea post-op and was started on Imodium.   Today, she reports the diarrhea has subsided and she has stopped the imodium. No fever, chills, nausea, or emesis. Otherwise doing well.   Vital signs: BP 112/60   Pulse (!) 59   Temp (!) 97.3 F (36.3 C) (Temporal)   Resp 12   Ht 5\' 4"  (1.626 m)   Wt 171 lb 3.2 oz (77.7 kg)   SpO2 98%   BMI 29.39 kg/m    Physical Exam: Constitutional: Well appearing female, NAD Abdomen: Soft, non-tender, non-distended, no rebound/guarding Skin: Laparoscopic incisions are well healed, no erythema or drainage  Assessment/Plan: This is a 28 y.o. female 1 month s/p laparoscopic cholecystectomy for acute cholecystitis    - discontinue imodium  - reviewed post-op care  - completed restrictions  - follow up prn  -- Edison Simon, PA-C [facility] 02/18/2019, 1:50 PM [phone] M-F: 7am - 4pm

## 2019-03-21 ENCOUNTER — Other Ambulatory Visit: Payer: BC Managed Care – PPO

## 2019-03-26 ENCOUNTER — Other Ambulatory Visit: Payer: BC Managed Care – PPO

## 2019-03-26 ENCOUNTER — Other Ambulatory Visit: Payer: BC Managed Care – PPO | Attending: Internal Medicine

## 2019-03-26 DIAGNOSIS — Z20822 Contact with and (suspected) exposure to covid-19: Secondary | ICD-10-CM | POA: Insufficient documentation

## 2019-03-27 LAB — NOVEL CORONAVIRUS, NAA: SARS-CoV-2, NAA: NOT DETECTED

## 2019-11-22 ENCOUNTER — Ambulatory Visit: Payer: Self-pay | Admitting: Obstetrics & Gynecology

## 2019-11-23 ENCOUNTER — Other Ambulatory Visit: Payer: Self-pay | Admitting: Obstetrics & Gynecology

## 2019-11-23 DIAGNOSIS — Z3045 Encounter for surveillance of transdermal patch hormonal contraceptive device: Secondary | ICD-10-CM

## 2020-01-18 ENCOUNTER — Ambulatory Visit: Payer: Self-pay

## 2020-10-23 IMAGING — US US ABDOMEN LIMITED
1 series · 13 of 25 positions shown · non-contrast
Comparison: None.

CLINICAL DATA: 28-year-old with upper abdominal pain.

EXAM:
ULTRASOUND ABDOMEN LIMITED RIGHT UPPER QUADRANT

[Series 1: us abdomen limited · 13 of 54 slices shown]
[im 1/54]
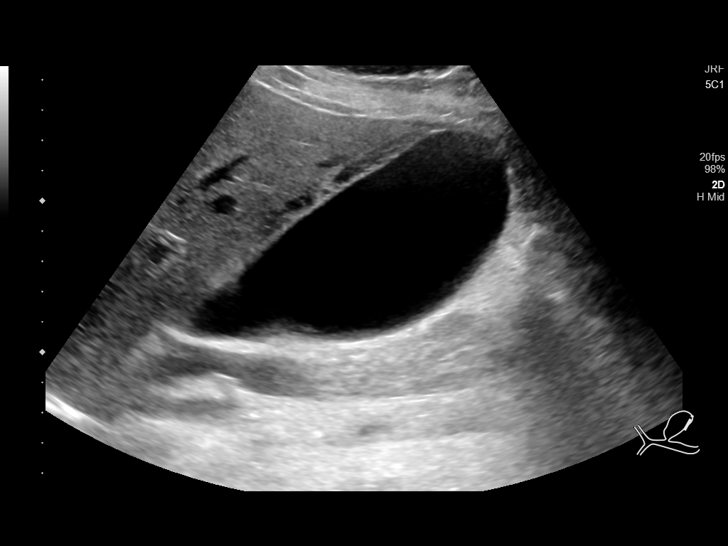
[im 5/54]
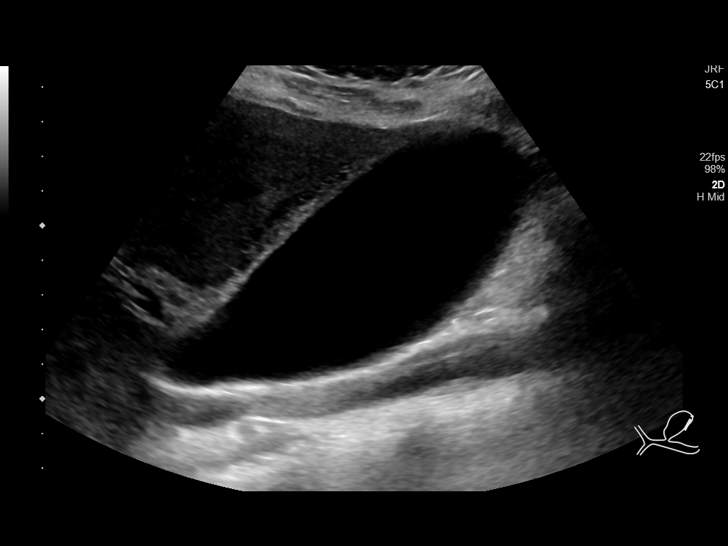
[im 9/54]
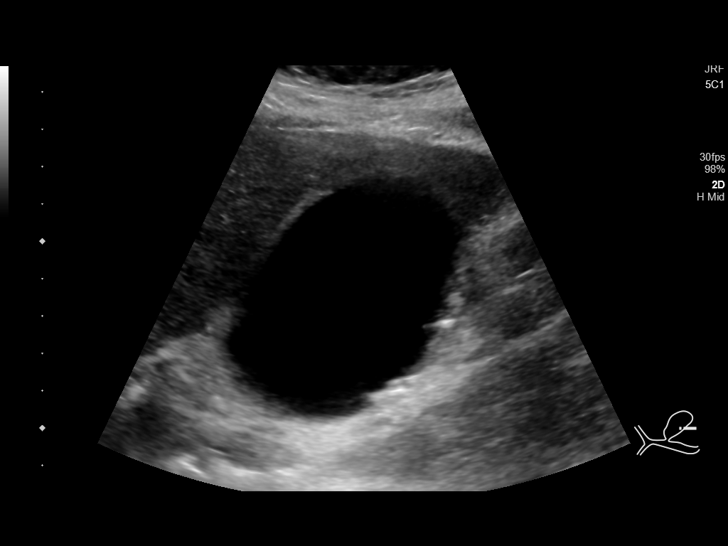
[im 14/54]
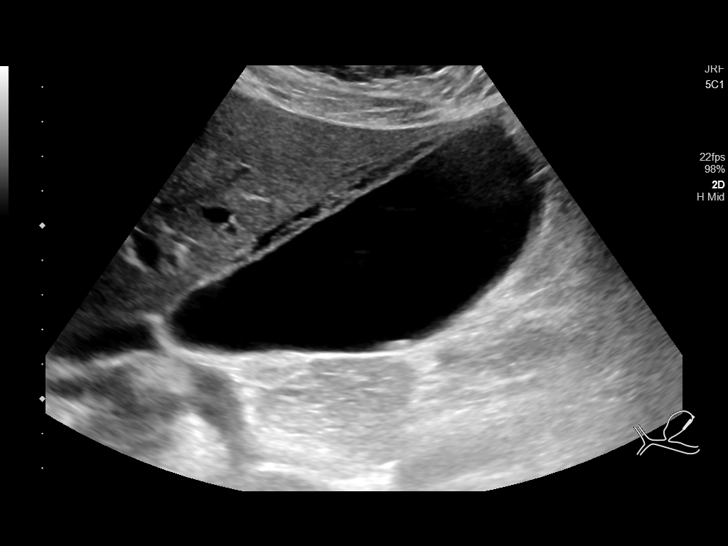
[im 18/54]
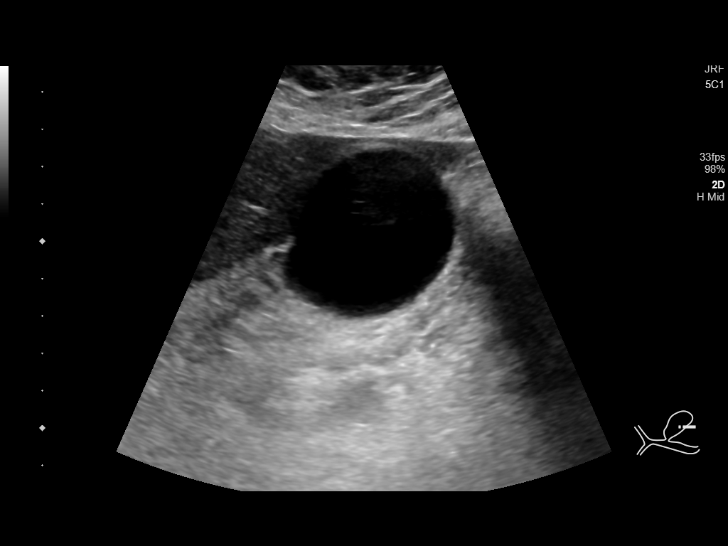
[im 23/54]
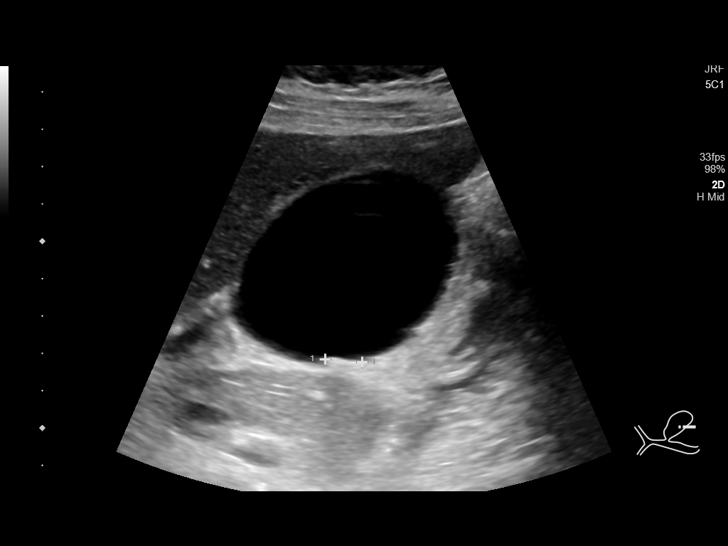
[im 27/54]
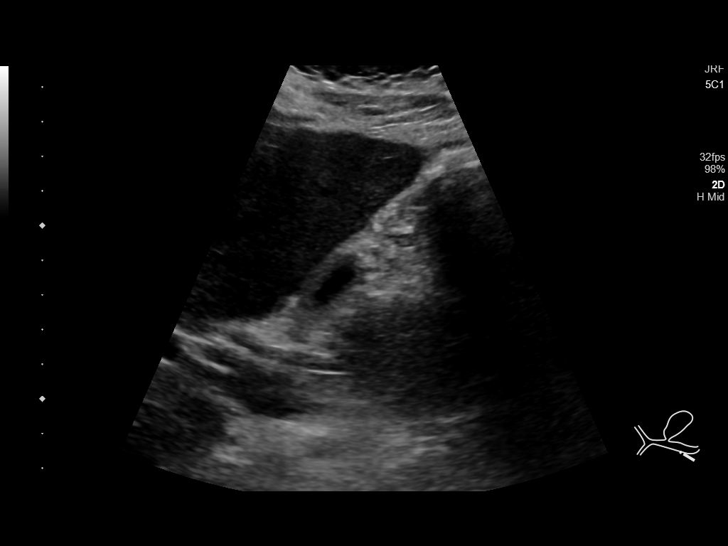
[im 31/54]
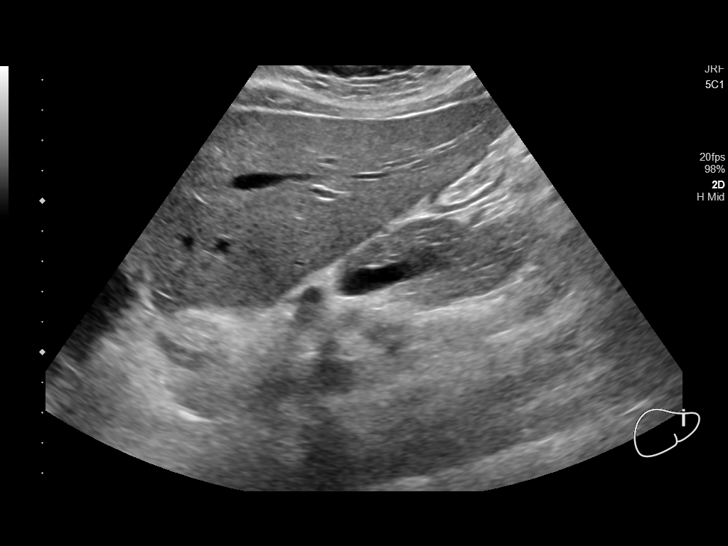
[im 36/54]
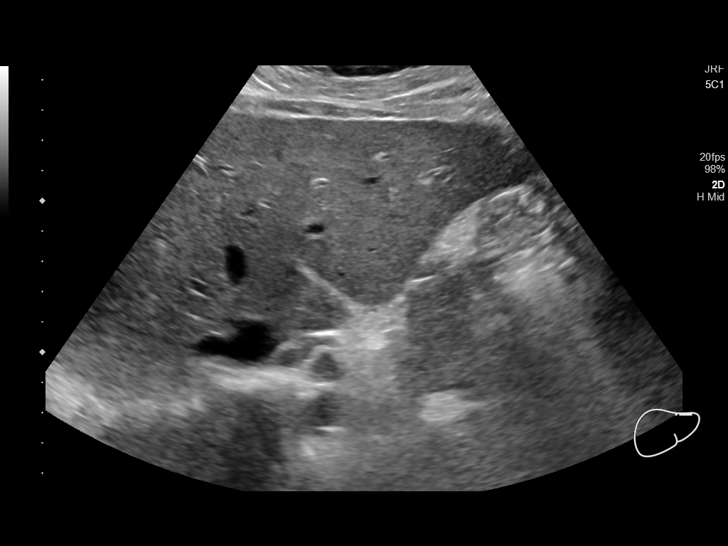
[im 40/54]
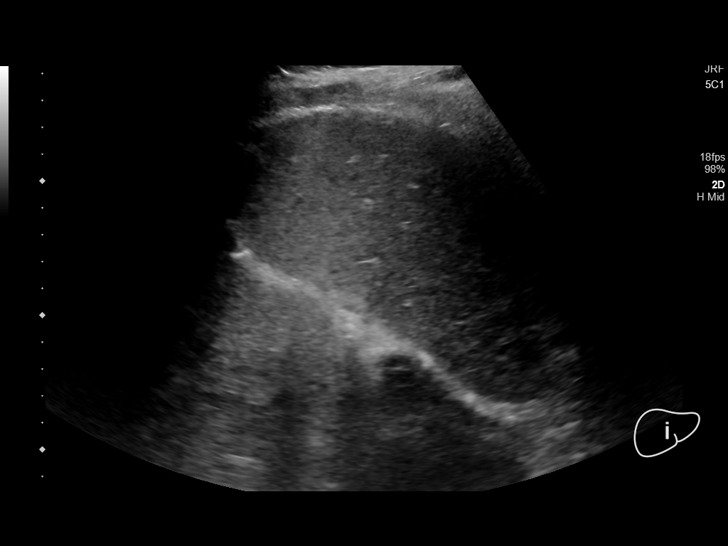
[im 45/54]
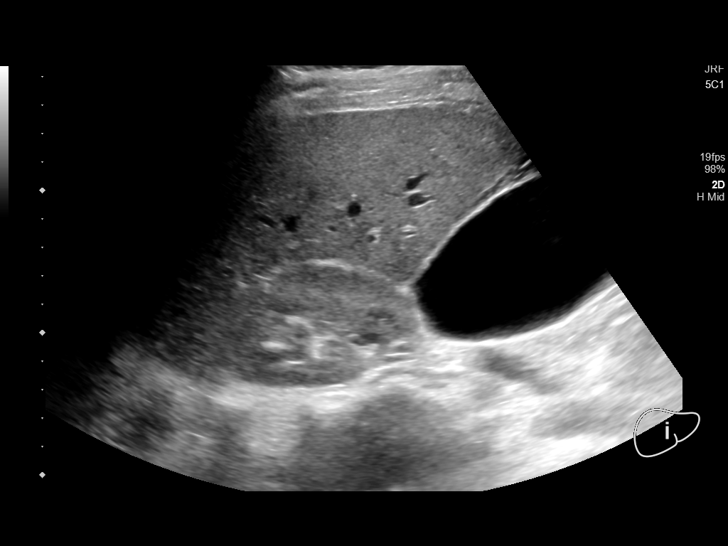
[im 49/54]
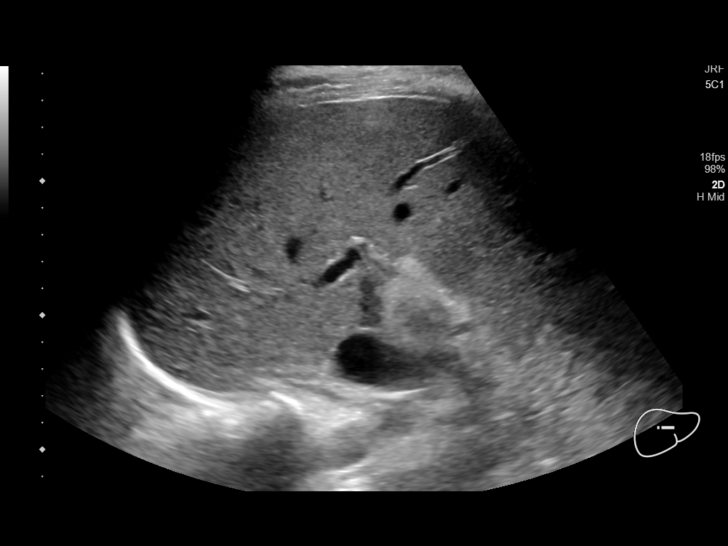
[im 54/54]
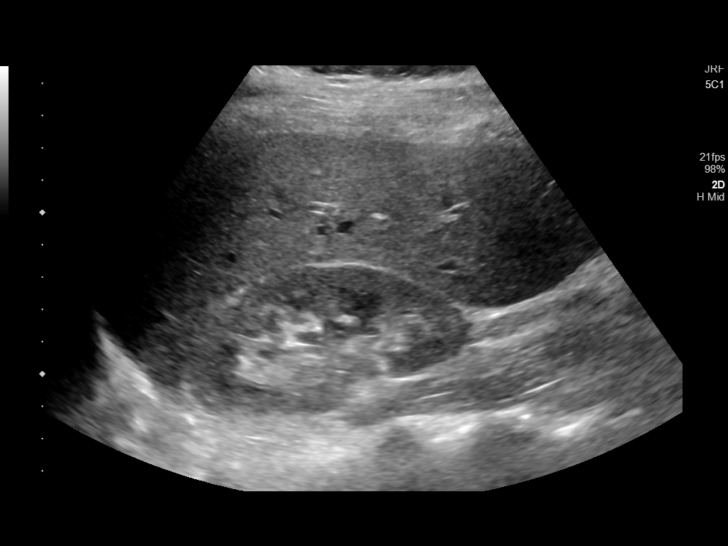

[13 of 25 positions shown; findings below may reference images not displayed]

FINDINGS: Gallbladder:

Gallbladder is distended. There is elongated flat echogenic
structure in the gallbladder which could represent a 1 cm stone.
Concern for edematous tissue between the gallbladder and the
adjacent liver. Reportedly, the patient does not have a sonographic
Murphy sign.

Common bile duct:

Diameter: 0.3 cm

Liver:

No focal lesion identified. Within normal limits in parenchymal
echogenicity. Portal vein is patent on color Doppler imaging with
normal direction of blood flow towards the liver.

Other: None.
IMPRESSION: Gallbladder is distended with a questionable 1 cm stone. There is
concern for edematous tissue or focal wall thickening between the
gallbladder and liver. No evidence for diffuse gallbladder wall
thickening. Findings are equivocal for cholecystitis and the patient
does not have a sonographic Murphy sign. Consider further
characterization with nuclear medicine hepatobiliary examination and
to evaluate for cholecystitis and cystic duct obstruction.

Normal appearance of the liver.  No biliary dilatation.

## 2020-11-18 ENCOUNTER — Encounter: Payer: Self-pay | Admitting: General Surgery

## 2022-04-28 ENCOUNTER — Ambulatory Visit: Payer: BC Managed Care – PPO | Admitting: Internal Medicine

## 2022-04-28 ENCOUNTER — Encounter: Payer: Self-pay | Admitting: Internal Medicine

## 2022-04-28 VITALS — BP 124/68 | HR 55 | Ht 63.0 in | Wt 176.2 lb

## 2022-04-28 DIAGNOSIS — R7302 Impaired glucose tolerance (oral): Secondary | ICD-10-CM

## 2022-04-28 DIAGNOSIS — R1033 Periumbilical pain: Secondary | ICD-10-CM | POA: Diagnosis not present

## 2022-04-28 DIAGNOSIS — E782 Mixed hyperlipidemia: Secondary | ICD-10-CM | POA: Insufficient documentation

## 2022-04-28 DIAGNOSIS — R1010 Upper abdominal pain, unspecified: Secondary | ICD-10-CM

## 2022-04-28 NOTE — Progress Notes (Signed)
Established Patient Office Visit  Subjective:  Patient ID: Sheila Fox, female    DOB: Dec 03, 1990  Age: 32 y.o. MRN: [id number]  Chief Complaint  Patient presents with   Follow-up    6 month follow up    Patient comes in for follow-up.  She is generally feeling quite well.  She has stopped her birth control pills so she is not having any more headaches.  She is not currently sexually active but knows she will have to use protection if she does.  She is fasting for blood work today. Patient however reports that since her cholecystectomy she has been having some mid abdominal pain and has yellow-colored bowel movements.  Which happens several times per day and they are quite loose.  The pain persists in the periumbilical area and sometimes it is in the upper abdomen.  She does not have any significant nausea or vomiting but feels some indigestion after certain types of food.  There is no blood in her stools. Patient denies any fevers or chills or weight loss.     Past Medical History:  Diagnosis Date   Hx of abnormal cervical Pap smear    Impaired glucose tolerance    Mixed hyperlipidemia    Past Surgical History:  Procedure Laterality Date   CESAREAN SECTION N/A 09/27/2017   Procedure: CESAREAN SECTION;  Surgeon: Gae Dry, MD;  Location: [facility] ORS;  Service: Obstetrics;  Laterality: N/A;  Female born @ [birth year] Apgars:9/9 Weight: 9lbs 8 ozs   CHOLECYSTECTOMY N/A 01/17/2019   Procedure: LAPAROSCOPIC CHOLECYSTECTOMY;  Surgeon: Fredirick Maudlin, MD;  Location: [facility] ORS;  Service: General;  Laterality: N/A;   FOOT SURGERY Left 2007   WISDOM TOOTH EXTRACTION  2009   all four    Social History   Socioeconomic History   Marital status: Single    Spouse name: Not on file   Number of children: Not on file   Years of education: Not on file   Highest education level: Not on file  Occupational History   Occupation: banker    Comment: [employer]  Tobacco Use    Smoking status: Never   Smokeless tobacco: Never  Vaping Use   Vaping Use: Never used  Substance and Sexual Activity   Alcohol use: Not Currently    Comment: social   Drug use: No   Sexual activity: Yes    Birth control/protection: None  Other Topics Concern   Not on file  Social History Narrative   Not on file   Social Determinants of Health   Financial Resource Strain: Not on file  Food Insecurity: Not on file  Transportation Needs: Not on file  Physical Activity: Not on file  Stress: Not on file  Social Connections: Not on file  Intimate Partner Violence: Not on file    Family History  Problem Relation Age of Onset   Hyperlipidemia Sister     No Known Allergies  Review of Systems  Constitutional:  Negative for chills, diaphoresis, fever, malaise/fatigue and weight loss.  HENT: Negative.    Eyes: Negative.   Respiratory: Negative.    Cardiovascular: Negative.   Gastrointestinal:  Positive for abdominal pain and diarrhea. Negative for blood in stool, heartburn, melena, nausea and vomiting.  Genitourinary: Negative.   Skin: Negative.   Neurological: Negative.   Endo/Heme/Allergies: Negative.   Psychiatric/Behavioral: Negative.         Objective:   BP 124/68   Pulse (!) 55   Ht  $'5\' 3"'s$  (1.6 m)   Wt 176 lb 3.2 oz (79.9 kg)   SpO2 97%   BMI 31.21 kg/m   Vitals:   04/28/22 0918  BP: 124/68  Pulse: (!) 55  Height: '5\' 3"'$  (1.6 m)  Weight: 176 lb 3.2 oz (79.9 kg)  SpO2: 97%  BMI (Calculated): 31.22    Physical Exam Vitals and nursing note reviewed.  Constitutional:      Appearance: Normal appearance.  HENT:     Head: Normocephalic.  Cardiovascular:     Rate and Rhythm: Normal rate and regular rhythm.  Pulmonary:     Effort: Pulmonary effort is normal.     Breath sounds: Normal breath sounds.  Abdominal:     General: Abdomen is flat. Bowel sounds are normal.     Palpations: Abdomen is soft.  Musculoskeletal:        General: Normal range of  motion.     Cervical back: Normal range of motion and neck supple.  Skin:    General: Skin is warm and dry.  Neurological:     General: No focal deficit present.     Mental Status: [name], place, and time.  Psychiatric:        Mood and Affect: Mood normal.      No results found for any visits on 04/28/22.  No results found for this or any previous visit (from the past 2160 hour(s)).    Assessment & Plan:  Check labs today.  And schedule a CAT scan of the abdomen. Patient advised to monitor her dietary intake carefully and avoid carbonated drinks and fried fatty food.  Will consider cholestyramine at follow-up for her loose stools if everything else is negative. Problem List Items Addressed This Visit     Impaired glucose tolerance   Mixed hyperlipidemia   Relevant Medications   atorvastatin (LIPITOR) 20 MG tablet   Other Relevant Orders   CMP14+EGFR   Pain of upper abdomen   Relevant Orders   CBC With Differential   Lipase   Periumbilical abdominal pain - Primary   Relevant Orders   CT ABDOMEN PELVIS W WO CONTRAST    Return in about 2 weeks (around 05/12/2022).   Total time spent: 30 minutes  Perrin Maltese, MD  04/28/2022

## 2022-04-29 LAB — CBC WITH DIFFERENTIAL
Basophils Absolute: 0 10*3/uL (ref 0.0–0.2)
Basos: 1 %
EOS (ABSOLUTE): 0.1 10*3/uL (ref 0.0–0.4)
Eos: 3 %
Hematocrit: 44.5 % (ref 34.0–46.6)
Hemoglobin: 14.5 g/dL (ref 11.1–15.9)
Immature Grans (Abs): 0 10*3/uL (ref 0.0–0.1)
Immature Granulocytes: 0 %
Lymphocytes Absolute: 1.5 10*3/uL (ref 0.7–3.1)
Lymphs: 49 %
MCH: 29.9 pg (ref 26.6–33.0)
MCHC: 32.6 g/dL (ref 31.5–35.7)
MCV: 92 fL (ref 79–97)
Monocytes Absolute: 0.2 10*3/uL (ref 0.1–0.9)
Monocytes: 7 %
Neutrophils Absolute: 1.2 10*3/uL — ABNORMAL LOW (ref 1.4–7.0)
Neutrophils: 40 %
RBC: 4.85 x10E6/uL (ref 3.77–5.28)
RDW: 12.9 % (ref 11.7–15.4)
WBC: 3.1 10*3/uL — ABNORMAL LOW (ref 3.4–10.8)

## 2022-04-29 LAB — CMP14+EGFR
ALT: 12 IU/L (ref 0–32)
AST: 18 IU/L (ref 0–40)
Albumin/Globulin Ratio: 1.9 (ref 1.2–2.2)
Albumin: 4.7 g/dL (ref 3.9–4.9)
Alkaline Phosphatase: 47 IU/L (ref 44–121)
BUN/Creatinine Ratio: 6 — ABNORMAL LOW (ref 9–23)
BUN: 7 mg/dL (ref 6–20)
Bilirubin Total: 0.4 mg/dL (ref 0.0–1.2)
CO2: 23 mmol/L (ref 20–29)
Calcium: 9.5 mg/dL (ref 8.7–10.2)
Chloride: 101 mmol/L (ref 96–106)
Creatinine, Ser: 1.09 mg/dL — ABNORMAL HIGH (ref 0.57–1.00)
Globulin, Total: 2.5 g/dL (ref 1.5–4.5)
Glucose: 82 mg/dL (ref 70–99)
Potassium: 4.1 mmol/L (ref 3.5–5.2)
Sodium: 137 mmol/L (ref 134–144)
Total Protein: 7.2 g/dL (ref 6.0–8.5)
eGFR: 69 mL/min/{1.73_m2} (ref >59)

## 2022-04-29 LAB — LIPASE: Lipase: 21 U/L (ref 14–72)

## 2022-04-29 LAB — LIPID PANEL W/O CHOL/HDL RATIO
Cholesterol, Total: 194 mg/dL (ref 100–199)
HDL: 56 mg/dL (ref >39)
LDL Chol Calc (NIH): 121 mg/dL — ABNORMAL HIGH (ref 0–99)
Triglycerides: 95 mg/dL (ref 0–149)
VLDL Cholesterol Cal: 17 mg/dL (ref 5–40)

## 2022-04-29 LAB — HEMOGLOBIN A1C
Est. average glucose Bld gHb Est-mCnc: 111 mg/dL
Hgb A1c MFr Bld: 5.5 % (ref 4.8–5.6)

## 2022-05-05 ENCOUNTER — Ambulatory Visit (INDEPENDENT_AMBULATORY_CARE_PROVIDER_SITE_OTHER): Payer: BC Managed Care – PPO

## 2022-05-05 DIAGNOSIS — R1033 Periumbilical pain: Secondary | ICD-10-CM | POA: Diagnosis not present

## 2022-05-05 MED ORDER — IOHEXOL 300 MG/ML  SOLN
100.0000 mL | Freq: Once | INTRAMUSCULAR | Status: AC | PRN
  Administered 2022-05-05: 100 mL via INTRAVENOUS

## 2022-05-12 ENCOUNTER — Encounter: Payer: Self-pay | Admitting: Internal Medicine

## 2022-05-12 ENCOUNTER — Ambulatory Visit: Payer: BC Managed Care – PPO | Admitting: Internal Medicine

## 2022-05-12 VITALS — BP 130/64 | HR 48 | Ht 63.0 in | Wt 176.6 lb

## 2022-05-12 DIAGNOSIS — R1033 Periumbilical pain: Secondary | ICD-10-CM | POA: Diagnosis not present

## 2022-05-12 DIAGNOSIS — E782 Mixed hyperlipidemia: Secondary | ICD-10-CM

## 2022-05-12 DIAGNOSIS — K9089 Other intestinal malabsorption: Secondary | ICD-10-CM

## 2022-05-12 MED ORDER — CHOLESTYRAMINE LIGHT 4 GM/DOSE PO POWD: 4.0000 g | Freq: Two times a day (BID) | ORAL | 12 refills | Status: DC

## 2022-05-12 NOTE — Progress Notes (Signed)
Established Patient Office Visit  Subjective:  Patient ID: Sheila Fox, female    DOB: [birth year]  Age: 32 y.o. MRN: [id number]  Chief Complaint  Patient presents with   Follow-up    2 week follow up    Patient comes in for her follow-up visit.  She had complained of loose bowel movements and periumbilical pain.  She had blood work and a CT scan of abdomen done.  Her labs were essentially unremarkable except for slightly elevated LDL cholesterol which she states that she had not been taking regularly.  Her CT scan was also unremarkable except for some small fat-containing peri-umbilical hernia. Patient generally feels well but she still has loose stools daily .  No blood or mucus in her stools.  No nausea vomiting, no chills or fever.  Patient does not have a gallbladder so strongly suspect bile salt induced diarrhea.  Patient is agreeable to try cholestyramine packets once or twice a day.  She will also try to avoid drinking carbonated drinks and eating fried fatty food.     Past Medical History:  Diagnosis Date   Hx of abnormal cervical Pap smear    Impaired glucose tolerance    Mixed hyperlipidemia     Past Surgical History:  Procedure Laterality Date   CESAREAN SECTION N/A 09/27/2017   Procedure: CESAREAN SECTION;  Surgeon: Gae Dry, MD;  Location: [facility];  Service: Obstetrics;  Laterality: N/A;  Female born @ [birth year] Apgars:9/9 Weight: 9lbs 8 ozs   CHOLECYSTECTOMY N/A 01/17/2019   Procedure: LAPAROSCOPIC CHOLECYSTECTOMY;  Surgeon: Fredirick Maudlin, MD;  Location: [facility];  Service: General;  Laterality: N/A;   FOOT SURGERY Left 2007   WISDOM TOOTH EXTRACTION  2009   all four    Social History   Socioeconomic History   Marital status: Single    Spouse name: Not on file   Number of children: Not on file   Years of education: Not on file   Highest education level: Not on file  Occupational History   Occupation: banker    Comment: [employer]   Tobacco Use   Smoking status: Never   Smokeless tobacco: Never  Vaping Use   Vaping Use: Never used  Substance and Sexual Activity   Alcohol use: Not Currently    Comment: social   Drug use: No   Sexual activity: Yes    Birth control/protection: None  Other Topics Concern   Not on file  Social History Narrative   Not on file   Social Determinants of Health   Financial Resource Strain: Not on file  Food Insecurity: Not on file  Transportation Needs: Not on file  Physical Activity: Not on file  Stress: Not on file  Social Connections: Not on file  Intimate Partner Violence: Not on file    Family History  Problem Relation Age of Onset   Hyperlipidemia Sister     No Known Allergies  Review of Systems  Constitutional: Negative.   HENT: Negative.    Eyes: Negative.   Respiratory: Negative.    Cardiovascular: Negative.   Gastrointestinal:  Positive for diarrhea. Negative for abdominal pain, blood in stool, constipation, heartburn, melena, nausea and vomiting.  Genitourinary: Negative.   Musculoskeletal: Negative.   Skin: Negative.   Neurological: Negative.   Endo/Heme/Allergies: Negative.   Psychiatric/Behavioral: Negative.         Objective:   BP 130/64   Pulse (!) 48   Ht '5\' 3"'$  (1.6 m)  Wt 176 lb 9.6 oz (80.1 kg)   LMP 04/20/2022 (Approximate)   SpO2 97%   BMI 31.28 kg/m   Vitals:   05/12/22 0919  BP: 130/64  Pulse: (!) 48  Height: '5\' 3"'$  (1.6 m)  Weight: 176 lb 9.6 oz (80.1 kg)  SpO2: 97%  BMI (Calculated): 31.29    Physical Exam Vitals and nursing note reviewed.  Constitutional:      Appearance: Normal appearance.  Cardiovascular:     Rate and Rhythm: Normal rate and regular rhythm.  Pulmonary:     Effort: Pulmonary effort is normal.     Breath sounds: Normal breath sounds.  Abdominal:     General: Abdomen is flat.     Palpations: Abdomen is soft.  Musculoskeletal:        General: Normal range of motion.     Cervical back: Normal  range of motion and neck supple.  Skin:    General: Skin is warm and dry.  Neurological:     General: No focal deficit present.     Mental Status: [name], place, and time.  Psychiatric:        Mood and Affect: Mood normal.        Behavior: Behavior normal.      No results found for any visits on 05/12/22.  Recent Results (from the past 2160 hour(s))  CBC With Differential     Status: Abnormal   Collection Time: 04/28/22 10:14 AM  Result Value Ref Range   WBC 3.1 (L) 3.4 - 10.8 x10E3/uL   RBC 4.85 3.77 - 5.28 x10E6/uL   Hemoglobin 14.5 11.1 - 15.9 g/dL   Hematocrit 44.5 34.0 - 46.6 %   MCV 92 79 - 97 fL   MCH 29.9 26.6 - 33.0 pg   MCHC 32.6 31.5 - 35.7 g/dL   RDW 12.9 11.7 - 15.4 %   Neutrophils 40 Not Estab. %   Lymphs 49 Not Estab. %   Monocytes 7 Not Estab. %   Eos 3 Not Estab. %   Basos 1 Not Estab. %   Neutrophils Absolute 1.2 (L) 1.4 - 7.0 x10E3/uL   Lymphocytes Absolute 1.5 0.7 - 3.1 x10E3/uL   Monocytes Absolute 0.2 0.1 - 0.9 x10E3/uL   EOS (ABSOLUTE) 0.1 0.0 - 0.4 x10E3/uL   Basophils Absolute 0.0 0.0 - 0.2 x10E3/uL   Immature Granulocytes 0 Not Estab. %   Immature Grans (Abs) 0.0 0.0 - 0.1 x10E3/uL  CMP14+EGFR     Status: Abnormal   Collection Time: 04/28/22 10:14 AM  Result Value Ref Range   Glucose 82 70 - 99 mg/dL   BUN 7 6 - 20 mg/dL   Creatinine, Ser 1.09 (H) 0.57 - 1.00 mg/dL   eGFR 69 >59 mL/min/1.73   BUN/Creatinine Ratio 6 (L) 9 - 23   Sodium 137 134 - 144 mmol/L   Potassium 4.1 3.5 - 5.2 mmol/L   Chloride 101 96 - 106 mmol/L   CO2 23 20 - 29 mmol/L   Calcium 9.5 8.7 - 10.2 mg/dL   Total Protein 7.2 6.0 - 8.5 g/dL   Albumin 4.7 3.9 - 4.9 g/dL   Globulin, Total 2.5 1.5 - 4.5 g/dL   Albumin/Globulin Ratio 1.9 1.2 - 2.2   Bilirubin Total 0.4 0.0 - 1.2 mg/dL   Alkaline Phosphatase 47 44 - 121 IU/L   AST 18 0 - 40 IU/L   ALT 12 0 - 32 IU/L  Lipid Panel w/o Chol/HDL Ratio  Status: Abnormal   Collection Time:  04/28/22 10:14 AM  Result Value Ref Range   Cholesterol, Total 194 100 - 199 mg/dL   Triglycerides 95 0 - 149 mg/dL   HDL 56 >39 mg/dL   VLDL Cholesterol Cal 17 5 - 40 mg/dL   LDL Chol Calc (NIH) 121 (H) 0 - 99 mg/dL  Lipase     Status: None   Collection Time: 04/28/22 10:14 AM  Result Value Ref Range   Lipase 21 14 - 72 U/L  Hemoglobin A1c     Status: None   Collection Time: 04/28/22 10:14 AM  Result Value Ref Range   Hgb A1c MFr Bld 5.5 4.8 - 5.6 %    Comment:          Prediabetes: 5.7 - 6.4          Diabetes: >6.4          Glycemic control for adults with diabetes: <7.0    Est. average glucose Bld gHb Est-mCnc 111 mg/dL      Assessment & Plan:  Prescription sent for cholestyramine packets.  Patient will start 1 to 2 packets/day.  Follow-up to discuss the results of this treatment. Problem List Items Addressed This Visit     Mixed hyperlipidemia   Relevant Medications   cholestyramine light (PREVALITE) 4 GM/DOSE powder   Periumbilical abdominal pain   Bile salt-induced diarrhea - Primary   Relevant Medications   cholestyramine light (PREVALITE) 4 GM/DOSE powder    Return in about 4 weeks (around 06/09/2022).   Total time spent: 25 minutes  Perrin Maltese, MD  05/12/2022

## 2022-06-16 ENCOUNTER — Encounter: Payer: Self-pay | Admitting: Internal Medicine

## 2022-06-16 ENCOUNTER — Ambulatory Visit: Payer: BC Managed Care – PPO | Admitting: Internal Medicine

## 2022-06-16 VITALS — BP 120/62 | HR 53 | Ht 63.0 in | Wt 176.0 lb

## 2022-06-16 DIAGNOSIS — E782 Mixed hyperlipidemia: Secondary | ICD-10-CM | POA: Diagnosis not present

## 2022-06-16 DIAGNOSIS — K9089 Other intestinal malabsorption: Secondary | ICD-10-CM | POA: Diagnosis not present

## 2022-06-16 DIAGNOSIS — R7302 Impaired glucose tolerance (oral): Secondary | ICD-10-CM

## 2022-06-16 NOTE — Progress Notes (Signed)
Established Patient Office Visit  Subjective:  Patient ID: Sheila Fox, female    DOB: [birth year]  Age: 32 y.o. MRN: [id number]  Chief Complaint  Patient presents with   Follow-up    4 week follow up    Patient comes in for her follow-up today.  She was previously having multiple loose bowel movements per day.  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  Today patient is feeling very well and she says she only has to take that packet once or twice a week and that prevents her from her from having loose bowel movements. No other complaints at this time.    No other concerns at this time.   Past Medical History:  Diagnosis Date   Hx of abnormal cervical Pap smear    Impaired glucose tolerance    Mixed hyperlipidemia     Past Surgical History:  Procedure Laterality Date   CESAREAN SECTION N/A 09/27/2017   Procedure: CESAREAN SECTION;  Surgeon: Nadara Mustard, MD;  Location: [facility];  Service: Obstetrics;  Laterality: N/A;  Female born @ [birth year] Apgars:9/9 Weight: 9lbs 8 ozs   CHOLECYSTECTOMY N/A 01/17/2019   Procedure: LAPAROSCOPIC CHOLECYSTECTOMY;  Surgeon: Duanne Guess, MD;  Location: [facility];  Service: General;  Laterality: N/A;   FOOT SURGERY Left 2007   WISDOM TOOTH EXTRACTION  2009   all four    Social History   Socioeconomic History   Marital status: Single    Spouse name: Not on file   Number of children: Not on file   Years of education: Not on file   Highest education level: Not on file  Occupational History   Occupation: banker    Comment: [employer]  Tobacco Use   Smoking status: Never   Smokeless tobacco: Never  Vaping Use   Vaping Use: Never used  Substance and Sexual Activity   Alcohol use: Not Currently    Comment: social   Drug use: No   Sexual activity: Yes    Birth control/protection: None  Other Topics Concern   Not on file  Social History Narrative   Not on file   Social  Determinants of Health   Financial Resource Strain: Not on file  Food Insecurity: Not on file  Transportation Needs: Not on file  Physical Activity: Not on file  Stress: Not on file  Social Connections: Not on file  Intimate Partner Violence: Not on file    Family History  Problem Relation Age of Onset   Hyperlipidemia Sister     No Known Allergies  Review of Systems  Constitutional:  Negative for chills, diaphoresis, fever, malaise/fatigue and weight loss.  HENT:  Negative for congestion, ear discharge, ear pain, hearing loss, nosebleeds and tinnitus.   Eyes:  Negative for blurred vision, double vision, photophobia and pain.  Respiratory:  Negative for cough, hemoptysis, sputum production and shortness of breath.   Cardiovascular:  Negative for chest pain, palpitations, orthopnea, claudication and leg swelling.  Gastrointestinal:  Negative for abdominal pain, blood in stool, constipation, diarrhea, heartburn, nausea and vomiting.  Genitourinary: Negative.   Musculoskeletal: Negative.   Neurological: Negative.   Endo/Heme/Allergies: Negative.   Psychiatric/Behavioral: Negative.         Objective:   BP 120/62   Pulse (!) 53   Ht  (1.6 m)   Wt 176 lb (79.8 kg)   SpO2 99%   BMI 31.18 kg/m   Vitals:   06/16/22  1048  BP: 120/62  Pulse: (!) 53  Height:  (1.6 m)  Weight: 176 lb (79.8 kg)  SpO2: 99%  BMI (Calculated): 31.18    Physical Exam Vitals and nursing note reviewed.  Constitutional:      Appearance: Normal appearance.  HENT:     Right Ear: Tympanic membrane normal.     Left Ear: Tympanic membrane normal.  Cardiovascular:     Rate and Rhythm: Normal rate.  Pulmonary:     Effort: Pulmonary effort is normal.     Breath sounds: Normal breath sounds. No wheezing or rales.  Abdominal:     General: Abdomen is flat.     Palpations: Abdomen is soft.  Musculoskeletal:        General: Normal range of motion.     Cervical back: Normal range of  motion and neck supple.  Skin:    General: Skin is warm.  Neurological:     General: No focal deficit present.     Mental Status: [name], place, and time.      No results found for any visits on 06/16/22.  Recent Results (from the past 2160 hour(s))  CBC With Differential     Status: Abnormal   Collection Time: 04/28/22 10:14 AM  Result Value Ref Range   WBC 3.1 (L) 3.4 - 10.8 x10E3/uL   RBC 4.85 3.77 - 5.28 x10E6/uL   Hemoglobin 14.5 11.1 - 15.9 g/dL   Hematocrit 47.4 25.9 - 46.6 %   MCV 92 79 - 97 fL   MCH 29.9 26.6 - 33.0 pg   MCHC 32.6 31.5 - 35.7 g/dL   RDW 56.3 87.5 - 64.3 %   Neutrophils 40 Not Estab. %   Lymphs 49 Not Estab. %   Monocytes 7 Not Estab. %   Eos 3 Not Estab. %   Basos 1 Not Estab. %   Neutrophils Absolute 1.2 (L) 1.4 - 7.0 x10E3/uL   Lymphocytes Absolute 1.5 0.7 - 3.1 x10E3/uL   Monocytes Absolute 0.2 0.1 - 0.9 x10E3/uL   EOS (ABSOLUTE) 0.1 0.0 - 0.4 x10E3/uL   Basophils Absolute 0.0 0.0 - 0.2 x10E3/uL   Immature Granulocytes 0 Not Estab. %   Immature Grans (Abs) 0.0 0.0 - 0.1 x10E3/uL  CMP14+EGFR     Status: Abnormal   Collection Time: 04/28/22 10:14 AM  Result Value Ref Range   Glucose 82 70 - 99 mg/dL   BUN 7 6 - 20 mg/dL   Creatinine, Ser 3.29 (H) 0.57 - 1.00 mg/dL   eGFR 69 >51 OA/CZY/6.06   BUN/Creatinine Ratio 6 (L) 9 - 23   Sodium 137 134 - 144 mmol/L   Potassium 4.1 3.5 - 5.2 mmol/L   Chloride 101 96 - 106 mmol/L   CO2 23 20 - 29 mmol/L   Calcium 9.5 8.7 - 10.2 mg/dL   Total Protein 7.2 6.0 - 8.5 g/dL   Albumin 4.7 3.9 - 4.9 g/dL   Globulin, Total 2.5 1.5 - 4.5 g/dL   Albumin/Globulin Ratio 1.9 1.2 - 2.2   Bilirubin Total 0.4 0.0 - 1.2 mg/dL   Alkaline Phosphatase 47 44 - 121 IU/L   AST 18 0 - 40 IU/L   ALT 12 0 - 32 IU/L  Lipid Panel w/o Chol/HDL Ratio     Status: Abnormal   Collection Time: 04/28/22 10:14 AM  Result Value Ref Range   Cholesterol, Total 194 100 - 199 mg/dL   Triglycerides 95 0 -  149 mg/dL   HDL 56 >16 mg/dL   VLDL Cholesterol Cal 17 5 - 40 mg/dL   LDL Chol Calc (NIH) 109 (H) 0 - 99 mg/dL  Lipase     Status: None   Collection Time: 04/28/22 10:14 AM  Result Value Ref Range   Lipase 21 14 - 72 U/L  Hemoglobin A1c     Status: None   Collection Time: 04/28/22 10:14 AM  Result Value Ref Range   Hgb A1c MFr Bld 5.5 4.8 - 5.6 %    Comment:          Prediabetes: 5.7 - 6.4          Diabetes: >6.4          Glycemic control for adults with diabetes: <7.0    Est. average glucose Bld gHb Est-mCnc 111 mg/dL      Assessment & Plan:  Patient advised to continue taking her medications as such as well as diet control. Problem List Items Addressed This Visit     Impaired glucose tolerance   Mixed hyperlipidemia   Bile salt-induced diarrhea - Primary    Return in about 2 months (around 08/16/2022).   Total time spent: 20 minutes  Margaretann Loveless, MD  06/16/2022

## 2022-07-14 ENCOUNTER — Ambulatory Visit (INDEPENDENT_AMBULATORY_CARE_PROVIDER_SITE_OTHER): Payer: BC Managed Care – PPO | Admitting: Advanced Practice Midwife

## 2022-07-14 ENCOUNTER — Other Ambulatory Visit (HOSPITAL_COMMUNITY)
Admission: RE | Admit: 2022-07-14 | Discharge: 2022-07-14 | Disposition: A | Discharge status: Home / Self Care | Payer: BC Managed Care – PPO | Source: Ambulatory Visit | Attending: Advanced Practice Midwife | Admitting: Advanced Practice Midwife

## 2022-07-14 ENCOUNTER — Encounter: Payer: Self-pay | Admitting: Advanced Practice Midwife

## 2022-07-14 VITALS — BP 119/72 | HR 43 | Ht 64.0 in | Wt 174.0 lb

## 2022-07-14 DIAGNOSIS — Z7251 High risk heterosexual behavior: Secondary | ICD-10-CM

## 2022-07-14 DIAGNOSIS — Z01419 Encounter for gynecological examination (general) (routine) without abnormal findings: Secondary | ICD-10-CM | POA: Diagnosis not present

## 2022-07-14 DIAGNOSIS — Z3202 Encounter for pregnancy test, result negative: Secondary | ICD-10-CM

## 2022-07-14 DIAGNOSIS — Z113 Encounter for screening for infections with a predominantly sexual mode of transmission: Secondary | ICD-10-CM | POA: Insufficient documentation

## 2022-07-14 DIAGNOSIS — Z1339 Encounter for screening examination for other mental health and behavioral disorders: Secondary | ICD-10-CM

## 2022-07-14 LAB — POCT URINE PREGNANCY: Preg Test, Ur: NEGATIVE

## 2022-07-14 MED ORDER — ELLA 30 MG PO TABS
1.0000 | ORAL_TABLET | Freq: Once | ORAL | 0 refills | Status: AC
Start: 2022-07-14 — End: 2022-07-14

## 2022-07-14 NOTE — Progress Notes (Signed)
Patient presents for Annual.  LMP: 07/14/2022 Last pap:  Hx of abnormal paps  11/19/2018 Contraception: None Mammogram: Not yet indicated STD Screening: Accepts Pt request UPT notes unprotected intercourse this past month spotting today yet wants assurance.  CC: Annual/None   Fun Fact:  Pt used to play the viola.   Pt left without getting STD Labs drawn.

## 2022-07-14 NOTE — Progress Notes (Signed)
GYNECOLOGY ANNUAL PREVENTATIVE CARE ENCOUNTER NOTE  History:     Sheila Fox is a 32 y.o. G29P1001 female here for a routine annual gynecologic exam.  Current complaints: none.   Denies abnormal vaginal bleeding, discharge, pelvic pain, problems with intercourse or other gynecologic concerns.  Patient prefers to avoid hormonal contraception and is not currently using birth control. She reports recent episode of unprotected sex but this is an outlier for her.  Does not smoke or vape. Lives with her son.    Gynecologic History Patient's last menstrual period was 07/14/2022. Contraception: none Last Pap: 11/19/2018. Result was normal with negative HPV Last Mammogram: N/A age 74, no concerns.   Obstetric History OB History  Gravida Para Term Preterm AB Living  1 1 1     1   SAB IAB Ectopic Multiple Live Births        0 1    # Outcome Date GA Lbr Len/2nd Weight Sex Delivery Anes PTL Lv  1 Term 09/27/17 [redacted]w[redacted]d  9 lb 7.7 oz (4.3 kg) M CS-LTranv Spinal, EPI  LIV    Past Medical History:  Diagnosis Date   Hx of abnormal cervical Pap smear    Impaired glucose tolerance    Mixed hyperlipidemia     Past Surgical History:  Procedure Laterality Date   CESAREAN SECTION N/A 09/27/2017   Procedure: CESAREAN SECTION;  Surgeon: Nadara Mustard, MD;  Location: [facility];  Service: Obstetrics;  Laterality: N/A;  Female born @ [birth year] Apgars:9/9 Weight: 9lbs 8 ozs   CHOLECYSTECTOMY N/A 01/17/2019   Procedure: LAPAROSCOPIC CHOLECYSTECTOMY;  Surgeon: Duanne Guess, MD;  Location: [facility];  Service: General;  Laterality: N/A;   FOOT SURGERY Left 2007   WISDOM TOOTH EXTRACTION  2009   all four    Current Outpatient Medications on File Prior to Visit  Medication Sig Dispense Refill   atorvastatin (LIPITOR) 20 MG tablet Take 20 mg by mouth daily.     cetirizine (ZYRTEC) 10 MG tablet Take 10 mg by mouth daily.     cholestyramine light (PREVALITE) 4 GM/DOSE powder Take 1 packet (4 g total) by  mouth 2 (two) times daily with a meal. 239.4 g 12   Vitamin D, Ergocalciferol, (DRISDOL) 1.25 MG (50000 UNIT) CAPS capsule Take 50,000 Units by mouth once a week.     XULANE 150-35 MCG/24HR transdermal patch APPLY 1 PATCH ON SKIN ONCE A WEEK( FOR 3 WEEKS ON AND THEN ONE WEEK OFF) 3 patch 1   No current facility-administered medications on file prior to visit.    No Known Allergies  Social History:  reports that she has never smoked. She has never used smokeless tobacco. She reports that she does not currently use alcohol. She reports that she does not use drugs.  Family History  Problem Relation Age of Onset   Hyperlipidemia Sister    Lymphoma Maternal Grandmother     The following portions of the patient's history were reviewed and updated as appropriate: allergies, current medications, past family history, past medical history, past social history, past surgical history and problem list.  Review of Systems Pertinent items noted in HPI and remainder of comprehensive ROS otherwise negative.  Physical Exam:  BP 119/72   Pulse (!) 43   Ht 5\' 4"  (1.626 m)   Wt 174 lb (78.9 kg)   LMP 07/14/2022   BMI 29.87 kg/m  CONSTITUTIONAL: Well-developed, well-nourished female in no acute distress.  HENT:  Normocephalic, atraumatic, External right and left  ear normal.  EYES: Conjunctivae and EOM are normal. Pupils are equal, round, and reactive to light. No scleral icterus.  SKIN: Skin is warm and dry. No rash noted. Not diaphoretic. No erythema. No pallor. MUSCULOSKELETAL: Normal range of motion. No tenderness.  No cyanosis, clubbing, or edema. NEUROLOGIC: Alert and oriented to person, place, and time. Normal reflexes, muscle tone coordination.  PSYCHIATRIC: Normal mood and affect. Normal behavior. Normal judgment and thought content. CARDIOVASCULAR: Normal heart rate noted, regular rhythm RESPIRATORY: Clear to auscultation bilaterally. Effort and breath sounds normal, no problems with  respiration noted. BREASTS: Symmetric in size. No masses, tenderness, skin changes, nipple drainage, or lymphadenopathy bilaterally. Performed in the presence of a chaperone. ABDOMEN: Soft, no distention noted.  No tenderness, rebound or guarding.  PELVIC: Normal appearing external genitalia and urethral meatus; normal appearing vaginal mucosa and cervix.  Small component of whitish-red discharge near cervical os. Not foul-smelling, not frothy. Pap smear obtained.  Performed in the presence of a chaperone.   Assessment and Plan:    1. Screening examination for STD (sexually transmitted disease)  - Cervicovaginal ancillary only( [place])  2. Well woman exam with routine gynecological exam - Great to meet [name], thanks for trusting our team with your care!! - Has PCP - Cytology - PAP  3. Unprotected sexual intercourse  - ulipristal acetate (ELLA) 30 MG tablet; Take 1 tablet (30 mg total) by mouth once for 1 dose.  Dispense: 1 tablet; Refill: 0 - POCT urine pregnancy  Will follow up results of pap smear and CV swab and manage accordingly. Routine preventative health maintenance measures emphasized. Please refer to After Visit Summary for other counseling recommendations.      Clayton Bibles, MSA, MSN, CNM Certified Nurse Midwife, Biochemist, clinical for [employer], [facility]

## 2022-07-15 LAB — CERVICOVAGINAL ANCILLARY ONLY
Bacterial Vaginitis (gardnerella): NEGATIVE
Candida Glabrata: NEGATIVE
Candida Vaginitis: POSITIVE — AB
Chlamydia: NEGATIVE
Comment: NEGATIVE
Comment: NEGATIVE
Comment: NEGATIVE
Comment: NEGATIVE
Comment: NEGATIVE
Comment: NORMAL
Neisseria Gonorrhea: NEGATIVE
Trichomonas: NEGATIVE

## 2022-07-15 MED ORDER — FLUCONAZOLE 150 MG PO TABS: 150.0000 mg | ORAL_TABLET | Freq: Once | ORAL | 0 refills | Status: AC

## 2022-07-15 NOTE — Addendum Note (Signed)
Addended by: Calvert Cantor on: 07/15/2022 03:09 PM   Modules accepted: Orders

## 2022-07-19 ENCOUNTER — Other Ambulatory Visit: Payer: BC Managed Care – PPO

## 2022-07-19 LAB — CYTOLOGY - PAP
Adequacy: ABSENT
Comment: NEGATIVE
Diagnosis: NEGATIVE
High risk HPV: NEGATIVE

## 2022-07-20 ENCOUNTER — Other Ambulatory Visit: Payer: BC Managed Care – PPO

## 2022-07-20 DIAGNOSIS — Z113 Encounter for screening for infections with a predominantly sexual mode of transmission: Secondary | ICD-10-CM

## 2022-07-21 LAB — RPR+HBSAG+HCVAB+...
HIV Screen 4th Generation wRfx: NONREACTIVE
Hep C Virus Ab: NONREACTIVE
Hepatitis B Surface Ag: NEGATIVE
RPR Ser Ql: NONREACTIVE

## 2022-07-27 ENCOUNTER — Other Ambulatory Visit: Payer: Self-pay | Admitting: *Deleted

## 2022-07-27 ENCOUNTER — Encounter: Payer: Self-pay | Admitting: Advanced Practice Midwife

## 2022-07-27 MED ORDER — FLUCONAZOLE 150 MG PO TABS: 150.0000 mg | ORAL_TABLET | Freq: Once | ORAL | 3 refills | Status: AC

## 2022-08-16 ENCOUNTER — Encounter: Payer: Self-pay | Admitting: Internal Medicine

## 2022-08-16 ENCOUNTER — Ambulatory Visit: Payer: BC Managed Care – PPO | Admitting: Internal Medicine

## 2022-08-16 VITALS — BP 100/70 | HR 50 | Ht 64.0 in | Wt 177.6 lb

## 2022-08-16 DIAGNOSIS — K9089 Other intestinal malabsorption: Secondary | ICD-10-CM

## 2022-08-16 DIAGNOSIS — E782 Mixed hyperlipidemia: Secondary | ICD-10-CM

## 2022-08-16 DIAGNOSIS — N760 Acute vaginitis: Secondary | ICD-10-CM | POA: Diagnosis not present

## 2022-08-16 DIAGNOSIS — B9689 Other specified bacterial agents as the cause of diseases classified elsewhere: Secondary | ICD-10-CM | POA: Insufficient documentation

## 2022-08-16 DIAGNOSIS — R7302 Impaired glucose tolerance (oral): Secondary | ICD-10-CM | POA: Diagnosis not present

## 2022-08-16 HISTORY — DX: Other specified bacterial agents as the cause of diseases classified elsewhere: B96.89

## 2022-08-16 MED ORDER — METRONIDAZOLE 0.75 % VA GEL
1.0000 | Freq: Every day | VAGINAL | 0 refills | Status: AC
Start: 2022-08-16 — End: 2022-08-21

## 2022-08-16 NOTE — Progress Notes (Signed)
Established Patient Office Visit  Subjective:  Patient ID: Sheila Fox, female    DOB: 06/26/1990  Age: 32 y.o. MRN: [id number]  Chief Complaint  Patient presents with   Follow-up    2 month follow up    Patient in office for 2 month follow up. Patient feeling well. No acute complaints. 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  She says she only has to take that packet once or twice a week as needed and that prevents her from her from having loose bowel movements.  Patient complains of having a "fishy" vaginal smell around the end of her menstrual cycle. Pap smear done 06/2022 by OB/GYN.  LDL 121 in 03/2022, patient confirms she is taking atorvastatin as prescribed.     No other concerns at this time.   Past Medical History:  Diagnosis Date   Hx of abnormal cervical Pap smear    Impaired glucose tolerance    Mixed hyperlipidemia     Past Surgical History:  Procedure Laterality Date   CESAREAN SECTION N/A 09/27/2017   Procedure: CESAREAN SECTION;  Surgeon: Nadara Mustard, MD;  Location: [facility] OR[facility];  Service: Obstetrics;  Laterality: N/A;  Female born @ [birth year] Apgars:9/9 Weight: 9lbs 8 ozs   CHOLECYSTECTOMY N/A 01/17/2019   Procedure: LAPAROSCOPIC CHOLECYSTECTOMY;  Surgeon: Duanne Guess, MD;  Location: [facility] ORS;  Service: General;  Laterality: N/A;   FOOT SURGERY Left 2007   WISDOM TOOTH EXTRACTION  2009   all four    Social History   Socioeconomic History   Marital status: Single    Spouse name: Not on file   Number of children: Not on file   Years of education: Not on file   Highest education level: Not on file  Occupational History   Occupation: banker    Comment: [employer]  Tobacco Use   Smoking status: Never   Smokeless tobacco: Never  Vaping Use   Vaping Use: Never used  Substance and Sexual Activity   Alcohol use: Not Currently    Comment: social   Drug use: No   Sexual activity: Not Currently     Partners: Male    Birth control/protection: None  Other Topics Concern   Not on file  Social History Narrative   Not on file   Social Determinants of Health   Financial Resource Strain: Not on file  Food Insecurity: Not on file  Transportation Needs: Not on file  Physical Activity: Not on file  Stress: Not on file  Social Connections: Not on file  Intimate Partner Violence: Not on file    Family History  Problem Relation Age of Onset   Hyperlipidemia Sister    Lymphoma Maternal Grandmother     No Known Allergies  Review of Systems  HENT: Negative.    Eyes: Negative.   Respiratory:  Negative for cough, shortness of breath and wheezing.   Cardiovascular:  Negative for chest pain, palpitations and leg swelling.  Gastrointestinal:  Negative for abdominal pain, blood in stool, constipation, diarrhea, heartburn, nausea and vomiting.  Genitourinary:  Negative for dysuria.  Musculoskeletal: Negative.   Neurological: Negative.   Endo/Heme/Allergies: Negative.   Psychiatric/Behavioral: Negative.  Negative for depression.        Objective:   BP 100/70   Pulse (!) 50   Ht 5\' 4"  (1.626 m)   Wt 177 lb 9.6 oz (80.6 kg)   SpO2 99%   BMI 30.48 kg/m   Vitals:  08/16/22 0905  BP: 100/70  Pulse: (!) 50  Height: 5\' 4"  (1.626 m)  Weight: 177 lb 9.6 oz (80.6 kg)  SpO2: 99%  BMI (Calculated): 30.47    Physical Exam Vitals and nursing note reviewed.  Constitutional:      Appearance: Normal appearance.  HENT:     Head: Normocephalic and atraumatic.     Nose: Nose normal.  Cardiovascular:     Rate and Rhythm: Normal rate and regular rhythm.     Heart sounds: Normal heart sounds.  Pulmonary:     Effort: Pulmonary effort is normal.     Breath sounds: Normal breath sounds.  Abdominal:     General: Abdomen is flat.  Musculoskeletal:        General: Normal range of motion.     Cervical back: Normal range of motion.  Skin:    General: Skin is warm and dry.   Neurological:     General: No focal deficit present.     Mental Status: [name], place, and time.  Psychiatric:        Mood and Affect: Mood normal.        Behavior: Behavior normal.      No results found for any visits on 08/16/22.  Recent Results (from the past 2160 hour(s))  Cervicovaginal ancillary only( [place])     Status: Abnormal   Collection Time: 07/14/22 10:20 AM  Result Value Ref Range   Neisseria Gonorrhea Negative    Chlamydia Negative    Trichomonas Negative    Bacterial Vaginitis (gardnerella) Negative    Candida Vaginitis Positive (A)    Candida Glabrata Negative    Comment      Normal Reference Range Bacterial Vaginosis - Negative   Comment Normal Reference Range Candida Species - Negative    Comment Normal Reference Range Candida Galbrata - Negative    Comment Normal Reference Range Trichomonas - Negative    Comment Normal Reference Ranger Chlamydia - Negative    Comment      Normal Reference Range Neisseria Gonorrhea - Negative  Cytology - PAP     Status: None   Collection Time: 07/14/22 10:20 AM  Result Value Ref Range   High risk HPV Negative    Adequacy      Satisfactory for evaluation; transformation zone component ABSENT.   Diagnosis      - Negative for intraepithelial lesion or malignancy (NILM)   Comment Normal Reference Range HPV - Negative   POCT urine pregnancy     Status: Normal   Collection Time: 07/14/22 10:53 AM  Result Value Ref Range   Preg Test, Ur Negative Negative  RPR+HBsAg+HCVAb+...     Status: None   Collection Time: 07/20/22  1:57 PM  Result Value Ref Range   Hepatitis B Surface Ag Negative Negative   Hep C Virus Ab Non Reactive Non Reactive    Comment: HCV antibody alone does not differentiate between previously resolved infection and active infection. Equivocal and Reactive HCV antibody results should be followed up with an HCV RNA test to support the diagnosis of active HCV infection.     RPR Ser Ql Non Reactive Non Reactive   HIV Screen 4th Generation wRfx Non Reactive Non Reactive    Comment: HIV-1/HIV-2 antibodies and HIV-1 p24 antigen were NOT detected. There is no laboratory evidence of HIV infection. HIV Negative       Assessment & Plan:  Patient fasting, will check blood work today.  Bile salt induced diarrhea controlled on current regimen.  Patient complains of vaginal "fishy" odor. Will add Metrogel.  Problem List Items Addressed This Visit     Impaired glucose tolerance   Relevant Orders   Hemoglobin A1c   Mixed hyperlipidemia   Relevant Orders   CMP14+EGFR   Lipid Panel w/o Chol/HDL Ratio   Bile salt-induced diarrhea - Primary   Bacterial vaginosis   Relevant Medications   metroNIDAZOLE (METROGEL) 0.75 % vaginal gel    Return in about 4 months (around 12/16/2022).   Total time spent: 30 minutes  Margaretann Loveless, MD  08/16/2022   This document may have been prepared by [facility] Voice Recognition software and as such may include unintentional dictation errors.

## 2022-08-17 LAB — CMP14+EGFR
ALT: 20 IU/L (ref 0–32)
AST: 35 IU/L (ref 0–40)
Albumin: 4.7 g/dL (ref 3.9–4.9)
Alkaline Phosphatase: 46 IU/L (ref 44–121)
BUN/Creatinine Ratio: 11 (ref 9–23)
BUN: 10 mg/dL (ref 6–20)
Bilirubin Total: 0.7 mg/dL (ref 0.0–1.2)
CO2: 25 mmol/L (ref 20–29)
Calcium: 9.7 mg/dL (ref 8.7–10.2)
Chloride: 102 mmol/L (ref 96–106)
Creatinine, Ser: 0.95 mg/dL (ref 0.57–1.00)
Globulin, Total: 2.2 g/dL (ref 1.5–4.5)
Glucose: 82 mg/dL (ref 70–99)
Potassium: 4.1 mmol/L (ref 3.5–5.2)
Sodium: 139 mmol/L (ref 134–144)
Total Protein: 6.9 g/dL (ref 6.0–8.5)
eGFR: 81 mL/min/{1.73_m2} (ref >59)

## 2022-08-17 LAB — LIPID PANEL W/O CHOL/HDL RATIO
Cholesterol, Total: 136 mg/dL (ref 100–199)
HDL: 58 mg/dL (ref >39)
LDL Chol Calc (NIH): 62 mg/dL (ref 0–99)
Triglycerides: 82 mg/dL (ref 0–149)
VLDL Cholesterol Cal: 16 mg/dL (ref 5–40)

## 2022-08-17 LAB — HEMOGLOBIN A1C
Est. average glucose Bld gHb Est-mCnc: 111 mg/dL
Hgb A1c MFr Bld: 5.5 % (ref 4.8–5.6)

## 2022-08-29 NOTE — Progress Notes (Signed)
Patient notified

## 2022-10-28 ENCOUNTER — Other Ambulatory Visit: Payer: Self-pay | Admitting: Internal Medicine

## 2022-12-16 ENCOUNTER — Encounter: Payer: Self-pay | Admitting: Internal Medicine

## 2022-12-16 ENCOUNTER — Ambulatory Visit: Payer: BC Managed Care – PPO | Admitting: Internal Medicine

## 2022-12-16 VITALS — BP 130/82 | HR 61 | Ht 64.0 in | Wt 175.4 lb

## 2022-12-16 DIAGNOSIS — R030 Elevated blood-pressure reading, without diagnosis of hypertension: Secondary | ICD-10-CM | POA: Diagnosis not present

## 2022-12-16 DIAGNOSIS — R7309 Other abnormal glucose: Secondary | ICD-10-CM | POA: Diagnosis not present

## 2022-12-16 DIAGNOSIS — E782 Mixed hyperlipidemia: Secondary | ICD-10-CM | POA: Diagnosis not present

## 2022-12-16 NOTE — Progress Notes (Signed)
Established Patient Office Visit  Subjective:  Patient ID: Sheila Fox, female    DOB: November 29, 1990  Age: 32 y.o. MRN: [id number]  Chief Complaint  Patient presents with   Follow-up    4 month follow up    Patient comes in for her follow-up today.  She is feeling well and is no longer having any diarrhea.  Fasting for labs today. Blood pressure is slightly elevated.  Advised to avoid salt and increasing physical activity.  Also to monitor blood pressure at home.    No other concerns at this time.   Past Medical History:  Diagnosis Date   Hx of abnormal cervical Pap smear    Impaired glucose tolerance    Mixed hyperlipidemia     Past Surgical History:  Procedure Laterality Date   CESAREAN SECTION N/A 09/27/2017   Procedure: CESAREAN SECTION;  Surgeon: Nadara Mustard, MD;  Location: [facility];  Service: Obstetrics;  Laterality: N/A;  Female born @ [birth year] Apgars:9/9 Weight: 9lbs 8 ozs   CHOLECYSTECTOMY N/A 01/17/2019   Procedure: LAPAROSCOPIC CHOLECYSTECTOMY;  Surgeon: Duanne Guess, MD;  Location: [facility];  Service: General;  Laterality: N/A;   FOOT SURGERY Left 2007   WISDOM TOOTH EXTRACTION  2009   all four    Social History   Socioeconomic History   Marital status: Single    Spouse name: Not on file   Number of children: Not on file   Years of education: Not on file   Highest education level: Not on file  Occupational History   Occupation: banker    Comment: [employer]  Tobacco Use   Smoking status: Never   Smokeless tobacco: Never  Vaping Use   Vaping status: Never Used  Substance and Sexual Activity   Alcohol use: Not Currently    Comment: social   Drug use: No   Sexual activity: Not Currently    Partners: Male    Birth control/protection: None  Other Topics Concern   Not on file  Social History Narrative   Not on file   Social Determinants of Health   Financial Resource Strain: Not on file  Food Insecurity: Not on file   Transportation Needs: Not on file  Physical Activity: Not on file  Stress: Not on file  Social Connections: Not on file  Intimate Partner Violence: Not on file    Family History  Problem Relation Age of Onset   Hyperlipidemia Sister    Lymphoma Maternal Grandmother     No Known Allergies  Review of Systems  Constitutional:  Negative for chills, fever, malaise/fatigue and weight loss.  HENT: Negative.  Negative for congestion, sinus pain and sore throat.   Eyes: Negative.   Respiratory: Negative.  Negative for cough and shortness of breath.   Cardiovascular: Negative.  Negative for chest pain, palpitations and leg swelling.  Gastrointestinal: Negative.  Negative for abdominal pain, constipation, diarrhea, heartburn, nausea and vomiting.  Genitourinary: Negative.  Negative for dysuria and flank pain.  Musculoskeletal: Negative.  Negative for joint pain and myalgias.  Skin: Negative.   Neurological: Negative.  Negative for dizziness and headaches.  Endo/Heme/Allergies: Negative.   Psychiatric/Behavioral: Negative.  Negative for depression and suicidal ideas. The patient is not nervous/anxious.        Objective:   BP 130/82   Pulse 61   Ht 5\' 4"  (1.626 m)   Wt 175 lb 6.4 oz (79.6 kg)   SpO2 98%   BMI 30.11 kg/m  Vitals:   12/16/22 0857  BP: 130/82  Pulse: 61  Height: 5\' 4"  (1.626 m)  Weight: 175 lb 6.4 oz (79.6 kg)  SpO2: 98%  BMI (Calculated): 30.09    Physical Exam Vitals and nursing note reviewed.  Constitutional:      Appearance: Normal appearance.  HENT:     Head: Normocephalic and atraumatic.     Nose: Nose normal.     Mouth/Throat:     Mouth: Mucous membranes are moist.     Pharynx: Oropharynx is clear.  Eyes:     Conjunctiva/sclera: Conjunctivae normal.     Pupils: Pupils are equal, round, and reactive to light.  Cardiovascular:     Rate and Rhythm: Normal rate and regular rhythm.     Pulses: Normal pulses.     Heart sounds: Normal heart  sounds. No murmur heard. Pulmonary:     Effort: Pulmonary effort is normal.     Breath sounds: Normal breath sounds. No wheezing.  Abdominal:     General: Bowel sounds are normal.     Palpations: Abdomen is soft.     Tenderness: There is no abdominal tenderness. There is no right CVA tenderness or left CVA tenderness.  Musculoskeletal:        General: Normal range of motion.     Cervical back: Normal range of motion.     Right lower leg: No edema.     Left lower leg: No edema.  Skin:    General: Skin is warm and dry.  Neurological:     General: No focal deficit present.     Mental Status: [name], place, and time.  Psychiatric:        Mood and Affect: Mood normal.        Behavior: Behavior normal.      No results found for any visits on 12/16/22.  No results found for this or any previous visit (from the past 2160 hour(s)).    Assessment & Plan:  Check labs.  Monitor blood pressure. Patient advised to bring record of home readings. Problem List Items Addressed This Visit     Mixed hyperlipidemia   Relevant Orders   CMP14+EGFR   Lipid Panel w/o Chol/HDL Ratio   Elevated glucose level   Relevant Orders   Hemoglobin A1c   Prehypertension - Primary   Relevant Orders   CBC with Diff    Return in about 1 month (around 01/16/2023).   Total time spent: 30 minutes  Margaretann Loveless, MD  12/16/2022   This document may have been prepared by [facility] Voice Recognition software and as such may include unintentional dictation errors.

## 2022-12-17 LAB — CBC WITH DIFFERENTIAL/PLATELET
Basophils Absolute: 0 10*3/uL (ref 0.0–0.2)
Basos: 1 %
EOS (ABSOLUTE): 0.1 10*3/uL (ref 0.0–0.4)
Eos: 2 %
Hematocrit: 41.2 % (ref 34.0–46.6)
Hemoglobin: 13.7 g/dL (ref 11.1–15.9)
Immature Grans (Abs): 0 10*3/uL (ref 0.0–0.1)
Immature Granulocytes: 0 %
Lymphocytes Absolute: 1.3 10*3/uL (ref 0.7–3.1)
Lymphs: 46 %
MCH: 30.5 pg (ref 26.6–33.0)
MCHC: 33.3 g/dL (ref 31.5–35.7)
MCV: 92 fL (ref 79–97)
Monocytes Absolute: 0.3 10*3/uL (ref 0.1–0.9)
Monocytes: 8 %
Neutrophils Absolute: 1.3 10*3/uL — ABNORMAL LOW (ref 1.4–7.0)
Neutrophils: 43 %
Platelets: 191 10*3/uL (ref 150–450)
RBC: 4.49 x10E6/uL (ref 3.77–5.28)
RDW: 12.9 % (ref 11.7–15.4)
WBC: 3 10*3/uL — ABNORMAL LOW (ref 3.4–10.8)

## 2022-12-17 LAB — CMP14+EGFR
ALT: 14 [IU]/L (ref 0–32)
AST: 17 [IU]/L (ref 0–40)
Albumin: 4.4 g/dL (ref 3.9–4.9)
Alkaline Phosphatase: 48 [IU]/L (ref 44–121)
BUN/Creatinine Ratio: 8 — ABNORMAL LOW (ref 9–23)
BUN: 8 mg/dL (ref 6–20)
Bilirubin Total: 0.6 mg/dL (ref 0.0–1.2)
CO2: 23 mmol/L (ref 20–29)
Calcium: 9.3 mg/dL (ref 8.7–10.2)
Chloride: 104 mmol/L (ref 96–106)
Creatinine, Ser: 1.05 mg/dL — ABNORMAL HIGH (ref 0.57–1.00)
Globulin, Total: 2.4 g/dL (ref 1.5–4.5)
Glucose: 80 mg/dL (ref 70–99)
Potassium: 4.2 mmol/L (ref 3.5–5.2)
Sodium: 140 mmol/L (ref 134–144)
Total Protein: 6.8 g/dL (ref 6.0–8.5)
eGFR: 72 mL/min/{1.73_m2} (ref >59)

## 2022-12-17 LAB — HEMOGLOBIN A1C
Est. average glucose Bld gHb Est-mCnc: 114 mg/dL
Hgb A1c MFr Bld: 5.6 % (ref 4.8–5.6)

## 2022-12-17 LAB — LIPID PANEL W/O CHOL/HDL RATIO
Cholesterol, Total: 130 mg/dL (ref 100–199)
HDL: 49 mg/dL (ref >39)
LDL Chol Calc (NIH): 66 mg/dL (ref 0–99)
Triglycerides: 73 mg/dL (ref 0–149)
VLDL Cholesterol Cal: 15 mg/dL (ref 5–40)

## 2022-12-27 NOTE — Progress Notes (Signed)
Patient notified

## 2023-01-16 ENCOUNTER — Encounter: Payer: Self-pay | Admitting: Internal Medicine

## 2023-01-16 ENCOUNTER — Ambulatory Visit: Payer: BC Managed Care – PPO | Admitting: Internal Medicine

## 2023-01-16 VITALS — BP 118/64 | HR 50 | Ht 64.0 in | Wt 170.6 lb

## 2023-01-16 DIAGNOSIS — E782 Mixed hyperlipidemia: Secondary | ICD-10-CM | POA: Diagnosis not present

## 2023-01-16 DIAGNOSIS — R7309 Other abnormal glucose: Secondary | ICD-10-CM | POA: Diagnosis not present

## 2023-01-16 DIAGNOSIS — R030 Elevated blood-pressure reading, without diagnosis of hypertension: Secondary | ICD-10-CM | POA: Diagnosis not present

## 2023-01-16 NOTE — Progress Notes (Signed)
Established Patient Office Visit  Subjective:  Patient ID: Sheila Fox, female    DOB: 1990/10/03  Age: 32 y.o. MRN: [id number]  Chief Complaint  Patient presents with   Follow-up    1 month follow up    Patient comes in for follow-up of her blood pressure.  At her last visit she had elevated blood pressure reading.  Since then she has started eating better and has lost weight and her blood pressure looks much better today. In general she is feeling well, advised to continue monitoring at home.    No other concerns at this time.   Past Medical History:  Diagnosis Date   Hx of abnormal cervical Pap smear    Impaired glucose tolerance    Mixed hyperlipidemia     Past Surgical History:  Procedure Laterality Date   CESAREAN SECTION N/A 09/27/2017   Procedure: CESAREAN SECTION;  Surgeon: Nadara Mustard, MD;  Location: [facility];  Service: Obstetrics;  Laterality: N/A;  Female born @ [birth year] Apgars:9/9 Weight: 9lbs 8 ozs   CHOLECYSTECTOMY N/A 01/17/2019   Procedure: LAPAROSCOPIC CHOLECYSTECTOMY;  Surgeon: Duanne Guess, MD;  Location: [facility] OR[facility];  Service: General;  Laterality: N/A;   FOOT SURGERY Left 2007   WISDOM TOOTH EXTRACTION  2009   all four    Social History   Socioeconomic History   Marital status: Single    Spouse name: Not on file   Number of children: Not on file   Years of education: Not on file   Highest education level: Not on file  Occupational History   Occupation: banker    Comment: [employer]  Tobacco Use   Smoking status: Never   Smokeless tobacco: Never  Vaping Use   Vaping status: Never Used  Substance and Sexual Activity   Alcohol use: Not Currently    Comment: social   Drug use: No   Sexual activity: Not Currently    Partners: Male    Birth control/protection: None  Other Topics Concern   Not on file  Social History Narrative   Not on file   Social Determinants of Health   Financial Resource Strain: Not on file   Food Insecurity: Not on file  Transportation Needs: Not on file  Physical Activity: Not on file  Stress: Not on file  Social Connections: Not on file  Intimate Partner Violence: Not on file    Family History  Problem Relation Age of Onset   Hyperlipidemia Sister    Lymphoma Maternal Grandmother     No Known Allergies  Outpatient Medications Prior to Visit  Medication Sig   atorvastatin (LIPITOR) 20 MG tablet TAKE 1 TABLET BY MOUTH EVERY DAY   cetirizine (ZYRTEC) 10 MG tablet Take 10 mg by mouth daily.   cholestyramine light (PREVALITE) 4 GM/DOSE powder Take 1 packet (4 g total) by mouth 2 (two) times daily with a meal. (Patient not taking: Reported on 12/16/2022)   Vitamin D, Ergocalciferol, (DRISDOL) 1.25 MG (50000 UNIT) CAPS capsule Take 50,000 Units by mouth once a week. (Patient not taking: Reported on 12/16/2022)   No facility-administered medications prior to visit.    Review of Systems  Constitutional: Negative.  Negative for chills, fever and malaise/fatigue.  HENT: Negative.  Negative for congestion, sinus pain and sore throat.   Eyes: Negative.   Respiratory: Negative.  Negative for cough and shortness of breath.   Cardiovascular: Negative.  Negative for chest pain, palpitations and leg swelling.  Gastrointestinal: Negative.  Negative for abdominal pain, constipation, diarrhea, heartburn, nausea and vomiting.  Genitourinary: Negative.  Negative for dysuria and flank pain.  Musculoskeletal: Negative.  Negative for joint pain and myalgias.  Skin: Negative.   Neurological: Negative.  Negative for dizziness and headaches.  Endo/Heme/Allergies: Negative.   Psychiatric/Behavioral: Negative.  Negative for depression and suicidal ideas. The patient is not nervous/anxious.        Objective:   BP 118/64   Pulse (!) 50   Ht 5\' 4"  (1.626 m)   Wt 170 lb 9.6 oz (77.4 kg)   SpO2 99%   BMI 29.28 kg/m   Vitals:   01/16/23 0912  BP: 118/64  Pulse: (!) 50  Height: 5'  4" (1.626 m)  Weight: 170 lb 9.6 oz (77.4 kg)  SpO2: 99%  BMI (Calculated): 29.27    Physical Exam Vitals and nursing note reviewed.  Constitutional:      Appearance: Normal appearance.  HENT:     Head: Normocephalic and atraumatic.     Nose: Nose normal.     Mouth/Throat:     Mouth: Mucous membranes are moist.     Pharynx: Oropharynx is clear.  Eyes:     Conjunctiva/sclera: Conjunctivae normal.     Pupils: Pupils are equal, round, and reactive to light.  Cardiovascular:     Rate and Rhythm: Normal rate and regular rhythm.     Pulses: Normal pulses.     Heart sounds: Normal heart sounds. No murmur heard. Pulmonary:     Effort: Pulmonary effort is normal.     Breath sounds: Normal breath sounds. No wheezing.  Abdominal:     General: Bowel sounds are normal.     Palpations: Abdomen is soft.     Tenderness: There is no abdominal tenderness. There is no right CVA tenderness or left CVA tenderness.  Musculoskeletal:        General: Normal range of motion.     Cervical back: Normal range of motion.     Right lower leg: No edema.     Left lower leg: No edema.  Skin:    General: Skin is warm and dry.  Neurological:     General: No focal deficit present.     Mental Status: [name], place, and time.  Psychiatric:        Mood and Affect: Mood normal.        Behavior: Behavior normal.      No results found for any visits on 01/16/23.  Recent Results (from the past 2160 hour(s))  CMP14+EGFR     Status: Abnormal   Collection Time: 12/16/22  9:47 AM  Result Value Ref Range   Glucose 80 70 - 99 mg/dL   BUN 8 6 - 20 mg/dL   Creatinine, Ser 7.25 (H) 0.57 - 1.00 mg/dL   eGFR 72 >36 UY/QIH/4.74   BUN/Creatinine Ratio 8 (L) 9 - 23   Sodium 140 134 - 144 mmol/L   Potassium 4.2 3.5 - 5.2 mmol/L   Chloride 104 96 - 106 mmol/L   CO2 23 20 - 29 mmol/L   Calcium 9.3 8.7 - 10.2 mg/dL   Total Protein 6.8 6.0 - 8.5 g/dL   Albumin 4.4 3.9 - 4.9 g/dL    Globulin, Total 2.4 1.5 - 4.5 g/dL   Bilirubin Total 0.6 0.0 - 1.2 mg/dL   Alkaline Phosphatase 48 44 - 121 IU/L   AST 17 0 - 40 IU/L   ALT 14 0 -  32 IU/L  Lipid Panel w/o Chol/HDL Ratio     Status: None   Collection Time: 12/16/22  9:47 AM  Result Value Ref Range   Cholesterol, Total 130 100 - 199 mg/dL   Triglycerides 73 0 - 149 mg/dL   HDL 49 >16 mg/dL   VLDL Cholesterol Cal 15 5 - 40 mg/dL   LDL Chol Calc (NIH) 66 0 - 99 mg/dL  Hemoglobin X0R     Status: None   Collection Time: 12/16/22  9:47 AM  Result Value Ref Range   Hgb A1c MFr Bld 5.6 4.8 - 5.6 %    Comment:          Prediabetes: 5.7 - 6.4          Diabetes: >6.4          Glycemic control for adults with diabetes: <7.0    Est. average glucose Bld gHb Est-mCnc 114 mg/dL  CBC with Diff     Status: Abnormal   Collection Time: 12/16/22  9:47 AM  Result Value Ref Range   WBC 3.0 (L) 3.4 - 10.8 x10E3/uL   RBC 4.49 3.77 - 5.28 x10E6/uL   Hemoglobin 13.7 11.1 - 15.9 g/dL   Hematocrit 60.4 54.0 - 46.6 %   MCV 92 79 - 97 fL   MCH 30.5 26.6 - 33.0 pg   MCHC 33.3 31.5 - 35.7 g/dL   RDW 98.1 19.1 - 47.8 %   Platelets 191 150 - 450 x10E3/uL   Neutrophils 43 Not Estab. %   Lymphs 46 Not Estab. %   Monocytes 8 Not Estab. %   Eos 2 Not Estab. %   Basos 1 Not Estab. %   Neutrophils Absolute 1.3 (L) 1.4 - 7.0 x10E3/uL   Lymphocytes Absolute 1.3 0.7 - 3.1 x10E3/uL   Monocytes Absolute 0.3 0.1 - 0.9 x10E3/uL   EOS (ABSOLUTE) 0.1 0.0 - 0.4 x10E3/uL   Basophils Absolute 0.0 0.0 - 0.2 x10E3/uL   Immature Granulocytes 0 Not Estab. %   Immature Grans (Abs) 0.0 0.0 - 0.1 x10E3/uL      Assessment & Plan:  Continue diet control and medications . Monitor blood pressure at home. Problem List Items Addressed This Visit     Mixed hyperlipidemia   Elevated glucose level   Prehypertension - Primary    Return in about 3 months (around 04/18/2023).   Total time spent: 25 minutes  Margaretann Loveless, MD  01/16/2023   This document  may have been prepared by [facility] Voice Recognition software and as such may include unintentional dictation errors.

## 2023-04-18 ENCOUNTER — Encounter: Payer: Self-pay | Admitting: Family

## 2023-04-18 ENCOUNTER — Ambulatory Visit (INDEPENDENT_AMBULATORY_CARE_PROVIDER_SITE_OTHER): Payer: BC Managed Care – PPO | Admitting: Family

## 2023-04-18 VITALS — BP 118/74 | HR 54 | Ht 64.0 in | Wt 160.0 lb

## 2023-04-18 DIAGNOSIS — R7309 Other abnormal glucose: Secondary | ICD-10-CM | POA: Diagnosis not present

## 2023-04-18 DIAGNOSIS — R5383 Other fatigue: Secondary | ICD-10-CM

## 2023-04-18 DIAGNOSIS — E538 Deficiency of other specified B group vitamins: Secondary | ICD-10-CM

## 2023-04-18 DIAGNOSIS — E559 Vitamin D deficiency, unspecified: Secondary | ICD-10-CM | POA: Insufficient documentation

## 2023-04-18 DIAGNOSIS — E782 Mixed hyperlipidemia: Secondary | ICD-10-CM | POA: Diagnosis not present

## 2023-04-18 DIAGNOSIS — R030 Elevated blood-pressure reading, without diagnosis of hypertension: Secondary | ICD-10-CM | POA: Diagnosis not present

## 2023-04-18 DIAGNOSIS — Z0130 Encounter for examination of blood pressure without abnormal findings: Secondary | ICD-10-CM

## 2023-04-18 NOTE — Assessment & Plan Note (Signed)
 A1C Continues to be in prediabetic ranges.  Will reassess at follow up after next lab check.  Patient counseled on dietary choices and verbalized understanding.

## 2023-04-18 NOTE — Assessment & Plan Note (Signed)
 Checking labs today.  Continue current therapy for lipid control, may consider decreasing atorvastatin to 10mg .  Will modify as needed based on labwork results.

## 2023-04-18 NOTE — Assessment & Plan Note (Signed)
 Blood pressure well controlled with current medications.  Continue current therapy.  Will reassess at follow up.

## 2023-04-18 NOTE — Assessment & Plan Note (Signed)
 Checking labs today.  Will continue supplements as needed.

## 2023-04-18 NOTE — Progress Notes (Signed)
 Established Patient Office Visit  Subjective:  Patient ID: Sheila Fox, female    DOB: 24-Dec-1990  Age: 33 y.o. MRN: [id number]  Chief Complaint  Patient presents with   Follow-up    3 month follow up    Patient is here today for her 3 months follow up.  She has been feeling well since last appointment.   She does not have additional concerns to discuss today.  She would like to look at decreasing and/or discontinuing her cholesterol medication.   Labs are due today. She needs refills.   I have reviewed her active problem list, medication list, allergies, health maintenance, notes from last encounter, lab results for her appointment today.     No other concerns at this time.   Past Medical History:  Diagnosis Date   Acute calculous cholecystitis 01/16/2019   Bacterial vaginosis 08/16/2022   Cesarean delivery delivered 11/08/2017   Hx of abnormal cervical Pap smear    Impaired glucose tolerance    Mixed hyperlipidemia     Past Surgical History:  Procedure Laterality Date   CESAREAN SECTION N/A 09/27/2017   Procedure: CESAREAN SECTION;  Surgeon: Nadara Mustard, MD;  Location: [facility] OR[facility];  Service: Obstetrics;  Laterality: N/A;  Female born @ 96 Apgars:9/9 Weight: 9lbs 8 ozs   CHOLECYSTECTOMY N/A 01/17/2019   Procedure: LAPAROSCOPIC CHOLECYSTECTOMY;  Surgeon: Duanne Guess, MD;  Location: [facility] OR[facility];  Service: General;  Laterality: N/A;   FOOT SURGERY Left 2007   WISDOM TOOTH EXTRACTION  2009   all four    Social History   Socioeconomic History   Marital status: Single    Spouse name: Not on file   Number of children: Not on file   Years of education: Not on file   Highest education level: Not on file  Occupational History   Occupation: banker    Comment: [employer]  Tobacco Use   Smoking status: Never   Smokeless tobacco: Never  Vaping Use   Vaping status: Never Used  Substance and Sexual Activity   Alcohol use: Not Currently     Comment: social   Drug use: No   Sexual activity: Not Currently    Partners: Male    Birth control/protection: None  Other Topics Concern   Not on file  Social History Narrative   Not on file   Social Drivers of Health   Financial Resource Strain: Not on file  Food Insecurity: Not on file  Transportation Needs: Not on file  Physical Activity: Not on file  Stress: Not on file  Social Connections: Not on file  Intimate Partner Violence: Not on file    Family History  Problem Relation Age of Onset   Hyperlipidemia Sister    Lymphoma Maternal Grandmother     No Known Allergies  Review of Systems  All other systems reviewed and are negative.      Objective:   BP 118/74   Pulse (!) 54   Ht 5\' 4"  (1.626 m)   Wt 160 lb (72.6 kg)   SpO2 99%   BMI 27.46 kg/m   Vitals:   04/18/23 0907  BP: 118/74  Pulse: (!) 54  Height: 5\' 4"  (1.626 m)  Weight: 160 lb (72.6 kg)  SpO2: 99%  BMI (Calculated): 27.45    Physical Exam Vitals and nursing note reviewed.  Constitutional:      Appearance: Normal appearance. She is normal weight.  HENT:     Head: Normocephalic and atraumatic.  Right Ear: Tympanic membrane normal.     Left Ear: Tympanic membrane normal.     Nose: Nose normal.  Eyes:     Extraocular Movements: Extraocular movements intact.     Conjunctiva/sclera: Conjunctivae normal.     Pupils: Pupils are equal, round, and reactive to light.  Cardiovascular:     Rate and Rhythm: Normal rate.  Pulmonary:     Effort: Pulmonary effort is normal.  Musculoskeletal:        General: Normal range of motion.     Cervical back: Normal range of motion.  Neurological:     General: No focal deficit present.     Mental Status: [name], place, and time. Mental status is at baseline.  Psychiatric:        Mood and Affect: Mood normal.        Behavior: Behavior normal.        Thought Content: Thought content normal.        Judgment: Judgment  normal.     No results found for any visits on 04/18/23.  No results found for this or any previous visit (from the past 2160 hours).     Assessment & Plan:   Problem List Items Addressed This Visit       Other   Mixed hyperlipidemia - Primary   Checking labs today.  Continue current therapy for lipid control, may consider decreasing atorvastatin to 10mg .  Will modify as needed based on labwork results.        Relevant Orders   Lipid panel   CMP14+EGFR   CBC with Diff   Elevated glucose level   A1C Continues to be in prediabetic ranges.  Will reassess at follow up after next lab check.  Patient counseled on dietary choices and verbalized understanding.        Relevant Orders   CMP14+EGFR   Hemoglobin A1c   CBC with Diff   Prehypertension   Blood pressure well controlled with current medications.  Continue current therapy.  Will reassess at follow up.        Relevant Orders   CMP14+EGFR   CBC with Diff   Vitamin D deficiency, unspecified   Checking labs today.  Will continue supplements as needed.        Relevant Orders   VITAMIN D 25 Hydroxy (Vit-D Deficiency, Fractures)   CMP14+EGFR   CBC with Diff   Other Visit Diagnoses       B12 deficiency due to diet       Checking labs today.  Will continue supplements as needed.   Relevant Orders   CMP14+EGFR   Vitamin B12   CBC with Diff     Other fatigue       Relevant Orders   CMP14+EGFR   TSH   CBC with Diff       Return in about 3 months (around 07/16/2023) for F/U.   Total time spent: 20 minutes  Miki Kins, FNP  04/18/2023   This document may have been prepared by [facility] Voice Recognition software and as such may include unintentional dictation errors.

## 2023-04-19 LAB — CMP14+EGFR
ALT: 13 [IU]/L (ref 0–32)
AST: 20 [IU]/L (ref 0–40)
Albumin: 4.8 g/dL (ref 3.9–4.9)
Alkaline Phosphatase: 50 [IU]/L (ref 44–121)
BUN/Creatinine Ratio: 7 — ABNORMAL LOW (ref 9–23)
BUN: 7 mg/dL (ref 6–20)
Bilirubin Total: 0.7 mg/dL (ref 0.0–1.2)
CO2: 22 mmol/L (ref 20–29)
Calcium: 9.7 mg/dL (ref 8.7–10.2)
Chloride: 103 mmol/L (ref 96–106)
Creatinine, Ser: 1.03 mg/dL — ABNORMAL HIGH (ref 0.57–1.00)
Globulin, Total: 2.2 g/dL (ref 1.5–4.5)
Glucose: 84 mg/dL (ref 70–99)
Potassium: 3.8 mmol/L (ref 3.5–5.2)
Sodium: 139 mmol/L (ref 134–144)
Total Protein: 7 g/dL (ref 6.0–8.5)
eGFR: 73 mL/min/{1.73_m2} (ref >59)

## 2023-04-19 LAB — LIPID PANEL
Chol/HDL Ratio: 3.1 {ratio} (ref 0.0–4.4)
Cholesterol, Total: 143 mg/dL (ref 100–199)
HDL: 46 mg/dL (ref >39)
LDL Chol Calc (NIH): 85 mg/dL (ref 0–99)
Triglycerides: 59 mg/dL (ref 0–149)
VLDL Cholesterol Cal: 12 mg/dL (ref 5–40)

## 2023-04-19 LAB — CBC WITH DIFFERENTIAL/PLATELET
Basophils Absolute: 0 10*3/uL (ref 0.0–0.2)
Basos: 1 %
EOS (ABSOLUTE): 0.1 10*3/uL (ref 0.0–0.4)
Eos: 2 %
Hematocrit: 42.9 % (ref 34.0–46.6)
Hemoglobin: 14.2 g/dL (ref 11.1–15.9)
Immature Grans (Abs): 0 10*3/uL (ref 0.0–0.1)
Immature Granulocytes: 0 %
Lymphocytes Absolute: 1.6 10*3/uL (ref 0.7–3.1)
Lymphs: 40 %
MCH: 30.6 pg (ref 26.6–33.0)
MCHC: 33.1 g/dL (ref 31.5–35.7)
MCV: 93 fL (ref 79–97)
Monocytes Absolute: 0.4 10*3/uL (ref 0.1–0.9)
Monocytes: 9 %
Neutrophils Absolute: 1.9 10*3/uL (ref 1.4–7.0)
Neutrophils: 48 %
Platelets: 185 10*3/uL (ref 150–450)
RBC: 4.64 x10E6/uL (ref 3.77–5.28)
RDW: 12.7 % (ref 11.7–15.4)
WBC: 3.9 10*3/uL (ref 3.4–10.8)

## 2023-04-19 LAB — HEMOGLOBIN A1C
Est. average glucose Bld gHb Est-mCnc: 108 mg/dL
Hgb A1c MFr Bld: 5.4 % (ref 4.8–5.6)

## 2023-04-19 LAB — TSH: TSH: 0.582 u[IU]/mL (ref 0.450–4.500)

## 2023-04-19 LAB — VITAMIN D 25 HYDROXY (VIT D DEFICIENCY, FRACTURES): Vit D, 25-Hydroxy: 35.6 ng/mL (ref 30.0–100.0)

## 2023-04-19 LAB — VITAMIN B12: Vitamin B-12: 250 pg/mL (ref 232–1245)

## 2023-05-09 ENCOUNTER — Ambulatory Visit: Payer: BC Managed Care – PPO | Admitting: Obstetrics and Gynecology

## 2023-05-09 ENCOUNTER — Other Ambulatory Visit (HOSPITAL_COMMUNITY)
Admission: RE | Admit: 2023-05-09 | Discharge: 2023-05-09 | Disposition: A | Discharge status: Home / Self Care | Source: Ambulatory Visit | Attending: Obstetrics and Gynecology | Admitting: Obstetrics and Gynecology

## 2023-05-09 VITALS — BP 118/65 | HR 47 | Wt 163.0 lb

## 2023-05-09 DIAGNOSIS — Z113 Encounter for screening for infections with a predominantly sexual mode of transmission: Secondary | ICD-10-CM | POA: Diagnosis not present

## 2023-05-09 DIAGNOSIS — N898 Other specified noninflammatory disorders of vagina: Secondary | ICD-10-CM | POA: Diagnosis not present

## 2023-05-09 NOTE — Progress Notes (Signed)
   RETURN GYNECOLOGY VISIT  Subjective:  Sheila Fox is a 33 y.o. G1P1001 with LMP 04/25/23 presenting for vaginal odor & discharge  Present x 1 week. Has had recurrent episodes of BV. OK with metrogel. Would also like STI testing.  Notes LLQ pain. Has noticed bowels have been off recently. Also sore from working out.   Objective:   Vitals:   05/09/23 1615  BP: 118/65  Pulse: (!) 47  Weight: 163 lb (73.9 kg)    General:  Alert, oriented and cooperative. Patient is in no acute distress.  Skin: Skin is warm and dry. No rash noted.   Cardiovascular: Bradycardia noted, patient asymptomatic, c/w baseline  Respiratory: Normal respiratory effort, no problems with respiration noted  Abdomen: Soft, mild LLQ tenderness, non distended  Pelvic: NEFG. +whiff. Normal uterus, no adnexal masses or tenderness.   Exam performed in the presence of a chaperone  Assessment and Plan:  Sheila Fox is a 33 y.o. with possible BV  1. Vaginal odor (Primary) 2. Screening examination for STI - Cervicovaginal ancillary only( [place]) - RPR+HBsAg+HCVAb+...  3. LLQ pain Suspect constipation or MSK pain No e/o gyn etiology today  Return if symptoms worsen or fail to improve.  Future Appointments  Date Time Provider Department Center  07/17/2023  9:15 AM Margaretann Loveless, MD [facility] None   Lennart Pall, MD

## 2023-05-09 NOTE — Progress Notes (Signed)
 RGYN here for recurrent BV notes odor x 1 week now wants swab and STD testing.  Last Annual 07/14/22 Last pap: 07/14/22 WNL

## 2023-05-09 NOTE — Patient Instructions (Signed)
 Avoid: - Synthetic underwear - Tight pants - Swim suits, thongs, leotards, leggings for prolonged periods of time - Scented soap/shampoo - Bubble baths - Scented detergents, dryer sheets - Baby wipes - Feminine sprays, douches, powders - Panty liners - Dyed toilet paper - Shaving  Trying swapping out the above for: - Cotton or no underwear - Loose pants, skirts, dresses - Changing out of swimwear, thongs, and workout gear as soon as you're done exercising - Fragrance free soaps (like Dove sensitive skin) - Warm plain water baths - Unscented laundry detergent - Use a bedet or peri bottle to rinse instead of baby wipes - Tampons, cotton pads, cotton period underwear - Undyed toilet paper - Clipping hair

## 2023-05-10 ENCOUNTER — Encounter: Payer: Self-pay | Admitting: Obstetrics and Gynecology

## 2023-05-10 LAB — RPR+HBSAG+HCVAB+...
HIV Screen 4th Generation wRfx: NONREACTIVE
Hep C Virus Ab: NONREACTIVE
Hepatitis B Surface Ag: NEGATIVE
RPR Ser Ql: NONREACTIVE

## 2023-05-11 ENCOUNTER — Encounter: Payer: Self-pay | Admitting: Obstetrics and Gynecology

## 2023-05-11 LAB — CERVICOVAGINAL ANCILLARY ONLY
Bacterial Vaginitis (gardnerella): POSITIVE — AB
Candida Glabrata: NEGATIVE
Candida Vaginitis: NEGATIVE
Chlamydia: NEGATIVE
Comment: NEGATIVE
Comment: NEGATIVE
Comment: NEGATIVE
Comment: NEGATIVE
Comment: NEGATIVE
Comment: NORMAL
Neisseria Gonorrhea: NEGATIVE
Trichomonas: NEGATIVE

## 2023-05-11 MED ORDER — METRONIDAZOLE 0.75 % VA GEL: 1.0000 | Freq: Every day | VAGINAL | 0 refills | Status: DC

## 2023-05-11 NOTE — Addendum Note (Signed)
 Addended by: Harvie Bridge on: 05/11/2023 06:56 PM   Modules accepted: Orders

## 2023-07-17 ENCOUNTER — Ambulatory Visit: Payer: BC Managed Care – PPO | Admitting: Internal Medicine

## 2023-07-18 ENCOUNTER — Ambulatory Visit: Admitting: Internal Medicine

## 2023-07-18 ENCOUNTER — Encounter: Payer: Self-pay | Admitting: Internal Medicine

## 2023-07-18 VITALS — BP 112/62 | HR 50 | Ht 64.0 in | Wt 160.0 lb

## 2023-07-18 DIAGNOSIS — E782 Mixed hyperlipidemia: Secondary | ICD-10-CM | POA: Diagnosis not present

## 2023-07-18 DIAGNOSIS — R030 Elevated blood-pressure reading, without diagnosis of hypertension: Secondary | ICD-10-CM

## 2023-07-18 DIAGNOSIS — R7309 Other abnormal glucose: Secondary | ICD-10-CM

## 2023-07-18 NOTE — Progress Notes (Signed)
 Established Patient Office Visit  Subjective:  Patient ID: Sheila Fox, female    DOB: January 27, 1991  Age: 33 y.o. MRN: [id number]  Chief Complaint  Patient presents with   Follow-up    3 month follow up    Patient comes in for her follow-up today.  She is generally feeling well and has no new complaints.  She is actively trying to control her diet and is exercising regularly and would like to stop her statin.  Patient will take a drug holiday from her Lipitor for next 3 months.  Continue strict diet control and exercise regimen.  Will check her fasting labs in 3 months and see if she needs to resume her Lipitor, may be at a lower dose.    No other concerns at this time.   Past Medical History:  Diagnosis Date   Acute calculous cholecystitis 01/16/2019   Bacterial vaginosis 08/16/2022   Cesarean delivery delivered 11/08/2017   Hx of abnormal cervical Pap smear    Impaired glucose tolerance    Mixed hyperlipidemia     Past Surgical History:  Procedure Laterality Date   CESAREAN SECTION N/A 09/27/2017   Procedure: CESAREAN SECTION;  Surgeon: Alben Alma, MD;  Location: [facility];  Service: Obstetrics;  Laterality: N/A;  Female born @ 2 Apgars:9/9 Weight: 9lbs 8 ozs   CHOLECYSTECTOMY N/A 01/17/2019   Procedure: LAPAROSCOPIC CHOLECYSTECTOMY;  Surgeon: Mercy Stall, MD;  Location: [facility] OR[facility];  Service: General;  Laterality: N/A;   FOOT SURGERY Left 2007   WISDOM TOOTH EXTRACTION  2009   all four    Social History   Socioeconomic History   Marital status: Single    Spouse name: Not on file   Number of children: Not on file   Years of education: Not on file   Highest education level: Not on file  Occupational History   Occupation: banker    Comment: [employer]  Tobacco Use   Smoking status: Never   Smokeless tobacco: Never  Vaping Use   Vaping status: Never Used  Substance and Sexual Activity   Alcohol use: Not Currently    Comment: social    Drug use: No   Sexual activity: Not Currently    Partners: Male    Birth control/protection: None  Other Topics Concern   Not on file  Social History Narrative   Not on file   Social Drivers of Health   Financial Resource Strain: Not on file  Food Insecurity: Not on file  Transportation Needs: Not on file  Physical Activity: Not on file  Stress: Not on file  Social Connections: Not on file  Intimate Partner Violence: Not on file    Family History  Problem Relation Age of Onset   Hyperlipidemia Sister    Lymphoma Maternal Grandmother     No Known Allergies  Outpatient Medications Prior to Visit  Medication Sig   atorvastatin (LIPITOR) 20 MG tablet TAKE 1 TABLET BY MOUTH EVERY DAY   cetirizine (ZYRTEC) 10 MG tablet Take 10 mg by mouth daily.   metroNIDAZOLE  (METROGEL ) 0.75 % vaginal gel Place 1 Applicatorful vaginally at bedtime. Apply one applicatorful to vagina at bedtime for 5 days   No facility-administered medications prior to visit.    Review of Systems  Constitutional: Negative.  Negative for chills, fever, malaise/fatigue and weight loss.  HENT: Negative.  Negative for ear discharge, sinus pain and sore throat.   Eyes: Negative.   Respiratory: Negative.  Negative for  cough and shortness of breath.   Cardiovascular: Negative.  Negative for chest pain, palpitations and leg swelling.  Gastrointestinal: Negative.  Negative for abdominal pain, constipation, diarrhea, heartburn, nausea and vomiting.  Genitourinary: Negative.  Negative for dysuria and flank pain.  Musculoskeletal: Negative.  Negative for joint pain and myalgias.  Skin: Negative.   Neurological: Negative.  Negative for dizziness, tingling, tremors and headaches.  Endo/Heme/Allergies: Negative.   Psychiatric/Behavioral: Negative.  Negative for depression and suicidal ideas. The patient is not nervous/anxious.        Objective:   BP 112/62   Pulse (!) 50   Ht 5\' 4"  (1.626 m)   Wt 160 lb (72.6  kg)   SpO2 98%   BMI 27.46 kg/m   Vitals:   07/18/23 0942  BP: 112/62  Pulse: (!) 50  Height: 5\' 4"  (1.626 m)  Weight: 160 lb (72.6 kg)  SpO2: 98%  BMI (Calculated): 27.45    Physical Exam Vitals and nursing note reviewed.  Constitutional:      Appearance: Normal appearance.  HENT:     Head: Normocephalic and atraumatic.     Nose: Nose normal.     Mouth/Throat:     Mouth: Mucous membranes are moist.     Pharynx: Oropharynx is clear.  Eyes:     Conjunctiva/sclera: Conjunctivae normal.     Pupils: Pupils are equal, round, and reactive to light.  Cardiovascular:     Rate and Rhythm: Normal rate and regular rhythm.     Pulses: Normal pulses.     Heart sounds: Normal heart sounds. No murmur heard. Pulmonary:     Effort: Pulmonary effort is normal.     Breath sounds: Normal breath sounds. No wheezing.  Abdominal:     General: Bowel sounds are normal.     Palpations: Abdomen is soft.     Tenderness: There is no abdominal tenderness. There is no right CVA tenderness or left CVA tenderness.  Musculoskeletal:        General: Normal range of motion.     Cervical back: Normal range of motion.     Right lower leg: No edema.     Left lower leg: No edema.  Skin:    General: Skin is warm and dry.  Neurological:     General: No focal deficit present.     Mental Status: [name], place, and time.  Psychiatric:        Mood and Affect: Mood normal.        Behavior: Behavior normal.      No results found for any visits on 07/18/23.  Recent Results (from the past 2160 hours)  Cervicovaginal ancillary only( [place])     Status: Abnormal   Collection Time: 05/09/23  4:33 PM  Result Value Ref Range   Neisseria Gonorrhea Negative    Chlamydia Negative    Trichomonas Negative    Bacterial Vaginitis (gardnerella) Positive (A)    Candida Vaginitis Negative    Candida Glabrata Negative    Comment      Normal Reference Range Bacterial Vaginosis -  Negative   Comment Normal Reference Range Candida Species - Negative    Comment Normal Reference Range Candida Galbrata - Negative    Comment Normal Reference Range Trichomonas - Negative    Comment Normal Reference Ranger Chlamydia - Negative    Comment      Normal Reference Range Neisseria Gonorrhea - Negative  RPR+HBsAg+HCVAb+...     Status:  None   Collection Time: 05/09/23  4:39 PM  Result Value Ref Range   Hepatitis B Surface Ag Negative Negative   Hep C Virus Ab Non Reactive Non Reactive    Comment: HCV antibody alone does not differentiate between previously resolved infection and active infection. Equivocal and Reactive HCV antibody results should be followed up with an HCV RNA test to support the diagnosis of active HCV infection.    RPR Ser Ql Non Reactive Non Reactive   HIV Screen 4th Generation wRfx Non Reactive Non Reactive    Comment: HIV-1/HIV-2 antibodies and HIV-1 p24 antigen were NOT detected. There is no laboratory evidence of HIV infection. HIV Negative       Assessment & Plan:  Continue strict diet control.  Will check fasting labs in 3 months before her next visit. Problem List Items Addressed This Visit     Mixed hyperlipidemia - Primary   Relevant Orders   Lipid Panel w/o Chol/HDL Ratio   Elevated glucose level   Relevant Orders   Hemoglobin A1c   Prehypertension   Relevant Orders   CMP14+EGFR    Return in about 3 months (around 10/18/2023).   Total time spent: 30 minutes  Aisha Hove, MD  07/18/2023   This document may have been prepared by [facility] Voice Recognition software and as such may include unintentional dictation errors.

## 2023-10-19 ENCOUNTER — Ambulatory Visit: Admitting: Internal Medicine

## 2023-10-19 ENCOUNTER — Encounter: Payer: Self-pay | Admitting: Internal Medicine

## 2023-10-19 VITALS — BP 104/74 | HR 52 | Ht 64.0 in | Wt 156.2 lb

## 2023-10-19 DIAGNOSIS — R030 Elevated blood-pressure reading, without diagnosis of hypertension: Secondary | ICD-10-CM

## 2023-10-19 DIAGNOSIS — E559 Vitamin D deficiency, unspecified: Secondary | ICD-10-CM

## 2023-10-19 DIAGNOSIS — E782 Mixed hyperlipidemia: Secondary | ICD-10-CM | POA: Diagnosis not present

## 2023-10-19 DIAGNOSIS — R7309 Other abnormal glucose: Secondary | ICD-10-CM

## 2023-10-19 NOTE — Progress Notes (Signed)
 Established Patient Office Visit  Subjective:  Patient ID: Sheila Fox, female    DOB: May 21, 1990  Age: 33 y.o. MRN: [id number]  Chief Complaint  Patient presents with   Follow-up    3 months follow up    Patient seen today for 3 month follow up. She states she is overall feeling well but has some increased stress at home and work and isn't sure if that is causing her periods to be slightly irregular. Discussed in detail birth control options that could help regulate cycles if patient feels menstrual cycles are becoming a significant problem. She denies wanting any birth control at this time. Patient says her strict diet and exercise have been going well and she has lost a little weight since her last visit. We will collect fasting labs today and follow up if she needs to start her statin medication again or can remain off of it. She denies any symptoms such as headache, chest pain, shortness of breath, palpitations, nausea, vomiting, or diarrhea.     No other concerns at this time.   Past Medical History:  Diagnosis Date   Acute calculous cholecystitis 01/16/2019   Bacterial vaginosis 08/16/2022   Cesarean delivery delivered 11/08/2017   Hx of abnormal cervical Pap smear    Impaired glucose tolerance    Mixed hyperlipidemia     Past Surgical History:  Procedure Laterality Date   CESAREAN SECTION N/A 09/27/2017   Procedure: CESAREAN SECTION;  Surgeon: Arloa Lamar SQUIBB, MD;  Location: [facility] ORS;  Service: Obstetrics;  Laterality: N/A;  Female born @ 59 Apgars:9/9 Weight: 9lbs 8 ozs   CHOLECYSTECTOMY N/A 01/17/2019   Procedure: LAPAROSCOPIC CHOLECYSTECTOMY;  Surgeon: Marolyn Nest, MD;  Location: [facility] ORS;  Service: General;  Laterality: N/A;   FOOT SURGERY Left 2007   WISDOM TOOTH EXTRACTION  2009   all four    Social History   Socioeconomic History   Marital status: Single    Spouse name: Not on file   Number of children: Not on file   Years of education: Not  on file   Highest education level: Not on file  Occupational History   Occupation: banker    Comment: [employer]  Tobacco Use   Smoking status: Never   Smokeless tobacco: Never  Vaping Use   Vaping status: Never Used  Substance and Sexual Activity   Alcohol use: Not Currently    Comment: social   Drug use: No   Sexual activity: Not Currently    Partners: Male    Birth control/protection: None  Other Topics Concern   Not on file  Social History Narrative   Not on file   Social Drivers of Health   Financial Resource Strain: Not on file  Food Insecurity: Not on file  Transportation Needs: Not on file  Physical Activity: Not on file  Stress: Not on file  Social Connections: Not on file  Intimate Partner Violence: Not on file    Family History  Problem Relation Age of Onset   Hyperlipidemia Sister    Lymphoma Maternal Grandmother     No Known Allergies  Outpatient Medications Prior to Visit  Medication Sig   cetirizine (ZYRTEC) 10 MG tablet Take 10 mg by mouth daily. (Patient taking differently: Take 10 mg by mouth as needed.)   atorvastatin (LIPITOR) 20 MG tablet TAKE 1 TABLET BY MOUTH EVERY DAY (Patient not taking: Reported on 10/19/2023)   metroNIDAZOLE  (METROGEL ) 0.75 % vaginal gel Place 1 Applicatorful  vaginally at bedtime. Apply one applicatorful to vagina at bedtime for 5 days (Patient not taking: Reported on 10/19/2023)   No facility-administered medications prior to visit.    Review of Systems  Constitutional: Negative.   HENT: Negative.    Eyes: Negative.   Respiratory: Negative.  Negative for cough and shortness of breath.   Cardiovascular: Negative.  Negative for chest pain, palpitations and leg swelling.  Gastrointestinal: Negative.  Negative for abdominal pain, constipation, diarrhea, heartburn, nausea and vomiting.  Genitourinary: Negative.  Negative for dysuria and flank pain.  Musculoskeletal: Negative.  Negative for joint pain and  myalgias.  Skin: Negative.   Neurological: Negative.  Negative for dizziness and headaches.  Endo/Heme/Allergies: Negative.   Psychiatric/Behavioral: Negative.  Negative for depression and suicidal ideas. The patient is not nervous/anxious.        Objective:   BP 104/74   Pulse (!) 52   Ht 5' 4 (1.626 m)   Wt 156 lb 3.2 oz (70.9 kg)   SpO2 99%   BMI 26.81 kg/m   Vitals:   10/19/23 0914  BP: 104/74  Pulse: (!) 52  Height: 5' 4 (1.626 m)  Weight: 156 lb 3.2 oz (70.9 kg)  SpO2: 99%  BMI (Calculated): 26.8    Physical Exam Vitals and nursing note reviewed.  Constitutional:      Appearance: Normal appearance.  HENT:     Head: Normocephalic and atraumatic.     Nose: Nose normal.     Mouth/Throat:     Mouth: Mucous membranes are moist.     Pharynx: Oropharynx is clear.  Eyes:     Conjunctiva/sclera: Conjunctivae normal.     Pupils: Pupils are equal, round, and reactive to light.  Cardiovascular:     Rate and Rhythm: Normal rate and regular rhythm.     Pulses: Normal pulses.     Heart sounds: Normal heart sounds. No murmur heard. Pulmonary:     Effort: Pulmonary effort is normal.     Breath sounds: Normal breath sounds. No wheezing.  Abdominal:     General: Bowel sounds are normal.     Palpations: Abdomen is soft.     Tenderness: There is no abdominal tenderness. There is no right CVA tenderness or left CVA tenderness.  Musculoskeletal:        General: Normal range of motion.     Cervical back: Normal range of motion.     Right lower leg: No edema.     Left lower leg: No edema.  Skin:    General: Skin is warm and dry.  Neurological:     General: No focal deficit present.     Mental Status: [name], place, and time.  Psychiatric:        Mood and Affect: Mood normal.        Behavior: Behavior normal.      No results found for any visits on 10/19/23.  No results found for this or any previous visit (from the past 2160  hours).    Assessment & Plan:  Collect fasting labs today. Follow up with patient on any abnormal results and potential plan to restart statin therapy. Continue strict diet and exercise plan. Monitor menstrual cycle irregularity and notify office if you would like to seek options to help. Problem List Items Addressed This Visit     Mixed hyperlipidemia - Primary   Elevated glucose level   Prehypertension   Relevant Orders   CMP14+EGFR  Vitamin D  deficiency   Relevant Orders   Vitamin D  (25 hydroxy)    Return in about 4 months (around 02/18/2024).   Total time spent: 25 minutes  FERNAND FREDY RAMAN, MD  10/19/2023   This document may have been prepared by [facility] Voice Recognition software and as such may include unintentional dictation errors.

## 2023-10-20 ENCOUNTER — Ambulatory Visit: Payer: Self-pay | Admitting: Internal Medicine

## 2023-10-20 LAB — LIPID PANEL W/O CHOL/HDL RATIO
Cholesterol, Total: 174 mg/dL (ref 100–199)
HDL: 38 mg/dL — ABNORMAL LOW (ref >39)
LDL Chol Calc (NIH): 111 mg/dL — ABNORMAL HIGH (ref 0–99)
Triglycerides: 139 mg/dL (ref 0–149)
VLDL Cholesterol Cal: 25 mg/dL (ref 5–40)

## 2023-10-20 LAB — CMP14+EGFR
ALT: 12 IU/L (ref 0–32)
AST: 18 IU/L (ref 0–40)
Albumin: 4.5 g/dL (ref 3.9–4.9)
Alkaline Phosphatase: 42 IU/L — ABNORMAL LOW (ref 44–121)
BUN/Creatinine Ratio: 8 — ABNORMAL LOW (ref 9–23)
BUN: 9 mg/dL (ref 6–20)
Bilirubin Total: 0.6 mg/dL (ref 0.0–1.2)
CO2: 21 mmol/L (ref 20–29)
Calcium: 9.5 mg/dL (ref 8.7–10.2)
Chloride: 104 mmol/L (ref 96–106)
Creatinine, Ser: 1.07 mg/dL — ABNORMAL HIGH (ref 0.57–1.00)
Globulin, Total: 2.5 g/dL (ref 1.5–4.5)
Glucose: 86 mg/dL (ref 70–99)
Potassium: 4.1 mmol/L (ref 3.5–5.2)
Sodium: 139 mmol/L (ref 134–144)
Total Protein: 7 g/dL (ref 6.0–8.5)
eGFR: 69 mL/min/1.73 (ref >59)

## 2023-10-20 LAB — HEMOGLOBIN A1C
Est. average glucose Bld gHb Est-mCnc: 103 mg/dL
Hgb A1c MFr Bld: 5.2 % (ref 4.8–5.6)

## 2023-10-20 LAB — VITAMIN D 25 HYDROXY (VIT D DEFICIENCY, FRACTURES): Vit D, 25-Hydroxy: 23.8 ng/mL — ABNORMAL LOW (ref 30.0–100.0)

## 2023-10-20 NOTE — Progress Notes (Signed)
 Patient notified

## 2024-02-19 ENCOUNTER — Encounter: Payer: Self-pay | Admitting: Internal Medicine

## 2024-02-19 ENCOUNTER — Ambulatory Visit: Admitting: Internal Medicine

## 2024-02-19 VITALS — BP 108/76 | HR 60 | Ht 64.0 in | Wt 160.0 lb

## 2024-02-19 DIAGNOSIS — E663 Overweight: Secondary | ICD-10-CM | POA: Diagnosis not present

## 2024-02-19 DIAGNOSIS — E559 Vitamin D deficiency, unspecified: Secondary | ICD-10-CM

## 2024-02-19 DIAGNOSIS — Z6827 Body mass index (BMI) 27.0-27.9, adult: Secondary | ICD-10-CM | POA: Insufficient documentation

## 2024-02-19 DIAGNOSIS — E782 Mixed hyperlipidemia: Secondary | ICD-10-CM

## 2024-02-19 DIAGNOSIS — E538 Deficiency of other specified B group vitamins: Secondary | ICD-10-CM | POA: Insufficient documentation

## 2024-02-19 DIAGNOSIS — R030 Elevated blood-pressure reading, without diagnosis of hypertension: Secondary | ICD-10-CM | POA: Diagnosis not present

## 2024-02-19 DIAGNOSIS — R5383 Other fatigue: Secondary | ICD-10-CM | POA: Diagnosis not present

## 2024-02-19 DIAGNOSIS — R7309 Other abnormal glucose: Secondary | ICD-10-CM

## 2024-02-19 NOTE — Progress Notes (Signed)
 "  Established Patient Office Visit  Subjective:  Patient ID: Sheila Fox, female    DOB: 02-24-1991  Age: 33 y.o. MRN: [id number]  Chief Complaint  Patient presents with   Follow-up    4 month follow up    Patient is here today for follow up. She reports feeling well and has no new concerns at this time. She is due for routine fasting blood work and will get that completed today. She reports trying to eat healthy diet and exercise. She states she has been drinking sugar free Gatorade regularly. Discussed water as best option for hydration.  She denies headache, chest pain, shortness of breath, palpitations, abdominal distress at this time.    No other concerns at this time.   Past Medical History:  Diagnosis Date   Acute calculous cholecystitis 01/16/2019   Bacterial vaginosis 08/16/2022   Cesarean delivery delivered 11/08/2017   Hx of abnormal cervical Pap smear    Impaired glucose tolerance    Mixed hyperlipidemia     Past Surgical History:  Procedure Laterality Date   CESAREAN SECTION N/A 09/27/2017   Procedure: CESAREAN SECTION;  Surgeon: Arloa Lamar SQUIBB, MD;  Location: [facility];  Service: Obstetrics;  Laterality: N/A;  Female born @ 36 Apgars:9/9 Weight: 9lbs 8 ozs   CHOLECYSTECTOMY N/A 01/17/2019   Procedure: LAPAROSCOPIC CHOLECYSTECTOMY;  Surgeon: Marolyn Nest, MD;  Location: [facility];  Service: General;  Laterality: N/A;   FOOT SURGERY Left 2007   WISDOM TOOTH EXTRACTION  2009   all four    Social History   Socioeconomic History   Marital status: Single    Spouse name: Not on file   Number of children: Not on file   Years of education: Not on file   Highest education level: Not on file  Occupational History   Occupation: banker    Comment: [employer]  Tobacco Use   Smoking status: Never   Smokeless tobacco: Never  Vaping Use   Vaping status: Never Used  Substance and Sexual Activity   Alcohol use: Not Currently    Comment:  social   Drug use: No   Sexual activity: Not Currently    Partners: Male    Birth control/protection: None  Other Topics Concern   Not on file  Social History Narrative   Not on file   Social Drivers of Health   Tobacco Use: Low Risk (02/19/2024)   Patient History    Smoking Tobacco Use: Never    Smokeless Tobacco Use: Never    Passive Exposure: Not on file  Financial Resource Strain: Not on file  Food Insecurity: Not on file  Transportation Needs: Not on file  Physical Activity: Not on file  Stress: Not on file  Social Connections: Not on file  Intimate Partner Violence: Not on file  Depression (PHQ2-9): Low Risk (10/19/2023)   Depression (PHQ2-9)    PHQ-2 Score: 2  Alcohol Screen: Not on file  Housing: Not on file  Utilities: Not on file  Health Literacy: Not on file    Family History  Problem Relation Age of Onset   Hyperlipidemia Sister    Lymphoma Maternal Grandmother     Allergies[1]  Show/hide medication list[2]  Review of Systems  Constitutional: Negative.  Negative for chills, fever and malaise/fatigue.  HENT: Negative.  Negative for congestion and sore throat.   Eyes: Negative.  Negative for blurred vision and pain.  Respiratory: Negative.  Negative for cough and shortness of breath.  Cardiovascular: Negative.  Negative for chest pain, palpitations and leg swelling.  Gastrointestinal: Negative.  Negative for abdominal pain, blood in stool, constipation, diarrhea, heartburn, melena, nausea and vomiting.  Genitourinary: Negative.  Negative for dysuria, flank pain, frequency and urgency.  Musculoskeletal: Negative.  Negative for joint pain and myalgias.  Skin: Negative.   Neurological: Negative.  Negative for dizziness, tingling, sensory change, weakness and headaches.  Endo/Heme/Allergies: Negative.   Psychiatric/Behavioral: Negative.  Negative for depression and suicidal ideas. The patient is not nervous/anxious.        Objective:   BP 108/76    Pulse 60   Ht 5' 4 (1.626 m)   Wt 160 lb (72.6 kg)   SpO2 98%   BMI 27.46 kg/m   Vitals:   02/19/24 0916  BP: 108/76  Pulse: 60  Height: 5' 4 (1.626 m)  Weight: 160 lb (72.6 kg)  SpO2: 98%  BMI (Calculated): 27.45    Physical Exam Vitals and nursing note reviewed.  Constitutional:      Appearance: Normal appearance.  HENT:     Head: Normocephalic and atraumatic.     Nose: Nose normal.     Mouth/Throat:     Mouth: Mucous membranes are moist.     Pharynx: Oropharynx is clear.  Eyes:     Conjunctiva/sclera: Conjunctivae normal.     Pupils: Pupils are equal, round, and reactive to light.  Cardiovascular:     Rate and Rhythm: Normal rate and regular rhythm.     Pulses: Normal pulses.     Heart sounds: Normal heart sounds. No murmur heard. Pulmonary:     Effort: Pulmonary effort is normal.     Breath sounds: Normal breath sounds. No wheezing.  Abdominal:     General: Bowel sounds are normal.     Palpations: Abdomen is soft.     Tenderness: There is no abdominal tenderness. There is no right CVA tenderness or left CVA tenderness.  Musculoskeletal:        General: Normal range of motion.     Cervical back: Normal range of motion.     Right lower leg: No edema.     Left lower leg: No edema.  Skin:    General: Skin is warm and dry.  Neurological:     General: No focal deficit present.     Mental Status: [name], place, and time.  Psychiatric:        Mood and Affect: Mood normal.        Behavior: Behavior normal.      No results found for any visits on 02/19/24.  No results found for this or any previous visit (from the past 2160 hours).    Assessment & Plan:  Continue working on healthy diet and exercise as tolerated to improve cholesterol and promote weight loss. Check routine blood work today and FU with patient on results.  Problem List Items Addressed This Visit       Other   Mixed hyperlipidemia - Primary   Relevant Orders    Lipid Panel w/o Chol/HDL Ratio   Elevated glucose level   Relevant Orders   Hemoglobin A1c   Prehypertension   Relevant Orders   CMP14+EGFR   Vitamin D  deficiency   Relevant Orders   Vitamin D  (25 hydroxy)   B12 deficiency   Relevant Orders   Vitamin B12   Fatigue   Relevant Orders   TSH   Iron, TIBC and Ferritin Panel  Overweight with body mass index (BMI) of 27 to 27.9 in adult    Return in about 3 months (around 05/19/2024).   Total time spent: 20 minutes. This time includes review of previous notes and results and patient face to face interaction during today's visit.    FERNAND FREDY RAMAN, MD  02/19/2024   This document may have been prepared by [facility] Voice Recognition software and as such may include unintentional dictation errors.     [1] No Known Allergies [2]  Outpatient Medications Prior to Visit  Medication Sig   atorvastatin (LIPITOR) 20 MG tablet TAKE 1 TABLET BY MOUTH EVERY DAY (Patient not taking: Reported on 02/19/2024)   [DISCONTINUED] cetirizine (ZYRTEC) 10 MG tablet Take 10 mg by mouth daily. (Patient not taking: Reported on 02/19/2024)   [DISCONTINUED] metroNIDAZOLE  (METROGEL ) 0.75 % vaginal gel Place 1 Applicatorful vaginally at bedtime. Apply one applicatorful to vagina at bedtime for 5 days (Patient not taking: Reported on 02/19/2024)   No facility-administered medications prior to visit.   "

## 2024-02-20 ENCOUNTER — Ambulatory Visit: Payer: Self-pay | Admitting: Internal Medicine

## 2024-02-20 DIAGNOSIS — E559 Vitamin D deficiency, unspecified: Secondary | ICD-10-CM

## 2024-02-20 DIAGNOSIS — E782 Mixed hyperlipidemia: Secondary | ICD-10-CM

## 2024-02-20 LAB — LIPID PANEL W/O CHOL/HDL RATIO
Cholesterol, Total: 186 mg/dL (ref 100–199)
HDL: 54 mg/dL
LDL Chol Calc (NIH): 116 mg/dL — ABNORMAL HIGH (ref 0–99)
Triglycerides: 89 mg/dL (ref 0–149)
VLDL Cholesterol Cal: 16 mg/dL (ref 5–40)

## 2024-02-20 LAB — CMP14+EGFR
ALT: 10 IU/L (ref 0–32)
AST: 20 IU/L (ref 0–40)
Albumin: 4.6 g/dL (ref 3.9–4.9)
Alkaline Phosphatase: 41 IU/L (ref 41–116)
BUN/Creatinine Ratio: 10 (ref 9–23)
BUN: 10 mg/dL (ref 6–20)
Bilirubin Total: 0.8 mg/dL (ref 0.0–1.2)
CO2: 20 mmol/L (ref 20–29)
Calcium: 9.9 mg/dL (ref 8.7–10.2)
Chloride: 104 mmol/L (ref 96–106)
Creatinine, Ser: 1.05 mg/dL — ABNORMAL HIGH (ref 0.57–1.00)
Globulin, Total: 2.6 g/dL (ref 1.5–4.5)
Glucose: 88 mg/dL (ref 70–99)
Potassium: 4.3 mmol/L (ref 3.5–5.2)
Sodium: 137 mmol/L (ref 134–144)
Total Protein: 7.2 g/dL (ref 6.0–8.5)
eGFR: 71 mL/min/1.73

## 2024-02-20 LAB — IRON,TIBC AND FERRITIN PANEL
Ferritin: 128 ng/mL (ref 15–150)
Iron Saturation: 46 % (ref 15–55)
Iron: 154 ug/dL (ref 27–159)
Total Iron Binding Capacity: 335 ug/dL (ref 250–450)
UIBC: 181 ug/dL (ref 131–425)

## 2024-02-20 LAB — VITAMIN B12: Vitamin B-12: 233 pg/mL (ref 232–1245)

## 2024-02-20 LAB — HEMOGLOBIN A1C
Est. average glucose Bld gHb Est-mCnc: 103 mg/dL
Hgb A1c MFr Bld: 5.2 % (ref 4.8–5.6)

## 2024-02-20 LAB — VITAMIN D 25 HYDROXY (VIT D DEFICIENCY, FRACTURES): Vit D, 25-Hydroxy: 21.5 ng/mL — ABNORMAL LOW (ref 30.0–100.0)

## 2024-02-20 LAB — TSH: TSH: 0.556 u[IU]/mL (ref 0.450–4.500)

## 2024-02-20 MED ORDER — ROSUVASTATIN CALCIUM 5 MG PO TABS: 5.0000 mg | ORAL_TABLET | Freq: Every day | ORAL | 3 refills | Status: AC

## 2024-02-20 MED ORDER — VITAMIN D3 1.25 MG (50000 UT) PO CAPS: 1.0000 | ORAL_CAPSULE | ORAL | 3 refills | Status: AC

## 2024-02-20 NOTE — Progress Notes (Signed)
 Patient notified

## 2024-06-11 ENCOUNTER — Ambulatory Visit: Admitting: Internal Medicine
